# Patient Record
Sex: Male | Born: 1965
Health system: Southern US, Community
[De-identification: ages and names within clinical notes are randomized; demographics above are authoritative.]

## PROBLEM LIST (undated history)

## (undated) DIAGNOSIS — L219 Seborrheic dermatitis, unspecified: Secondary | ICD-10-CM

## (undated) DIAGNOSIS — R2230 Localized swelling, mass and lump, unspecified upper limb: Secondary | ICD-10-CM

## (undated) HISTORY — DX: Localized swelling, mass and lump, unspecified upper limb: R22.30

## (undated) HISTORY — DX: Seborrheic dermatitis, unspecified: L21.9

---

## 2004-05-03 HISTORY — PX: HERNIA REPAIR: SHX51

## 2005-05-03 HISTORY — PX: FOOT SURGERY: SHX648

## 2008-05-03 HISTORY — PX: FRACTURE SURGERY: SHX138

## 2008-09-12 ENCOUNTER — Encounter: Admission: RE | Admit: 2008-09-12 | Discharge: 2008-09-12 | Payer: Self-pay | Admitting: Family Medicine

## 2008-09-18 ENCOUNTER — Ambulatory Visit (HOSPITAL_BASED_OUTPATIENT_CLINIC_OR_DEPARTMENT_OTHER): Admission: RE | Admit: 2008-09-18 | Discharge: 2008-09-18 | Payer: Self-pay | Admitting: Orthopedic Surgery

## 2010-06-06 IMAGING — CR DG FINGER LITTLE 2+V*L*
3 series · 3 of 3 positions shown · non-contrast
Comparison: None

CLINICAL DATA: Sports injury to left fifth finger

LEFT LITTLE FINGER 2+V

[view not recorded (1 of 3)]
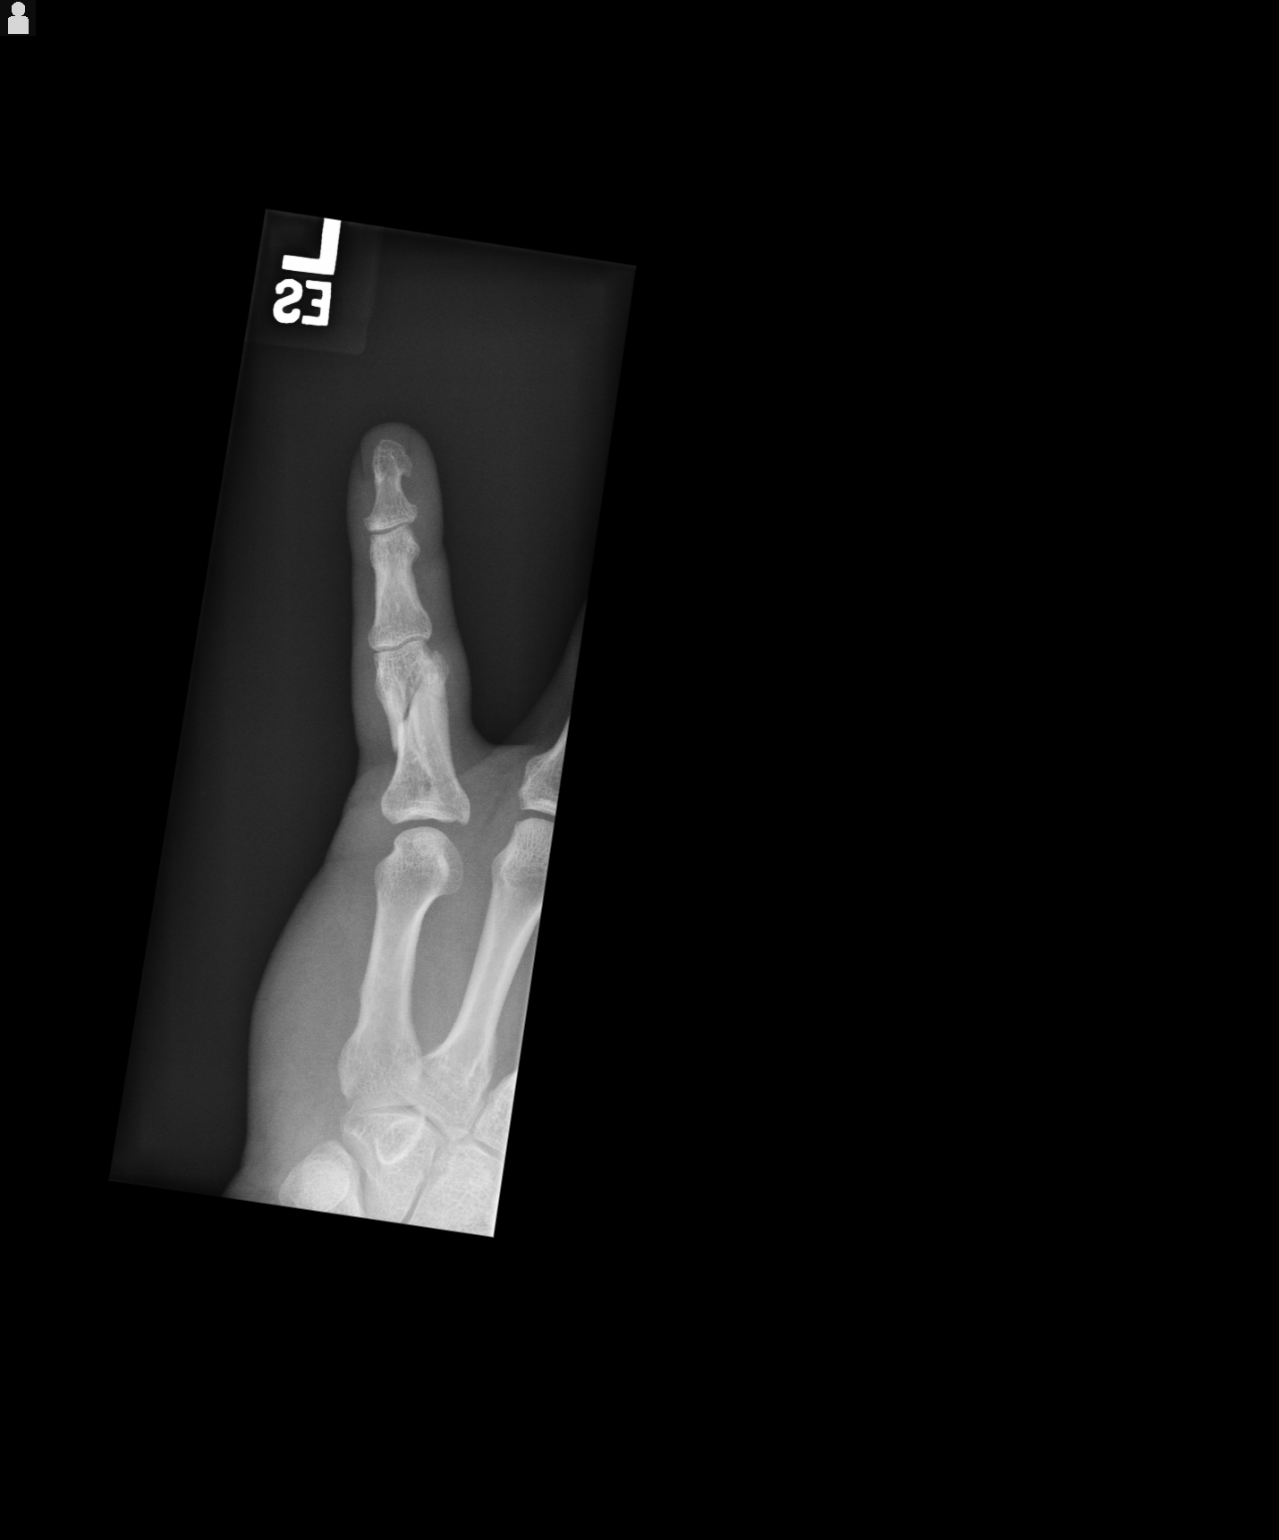

[view not recorded (2 of 3)]
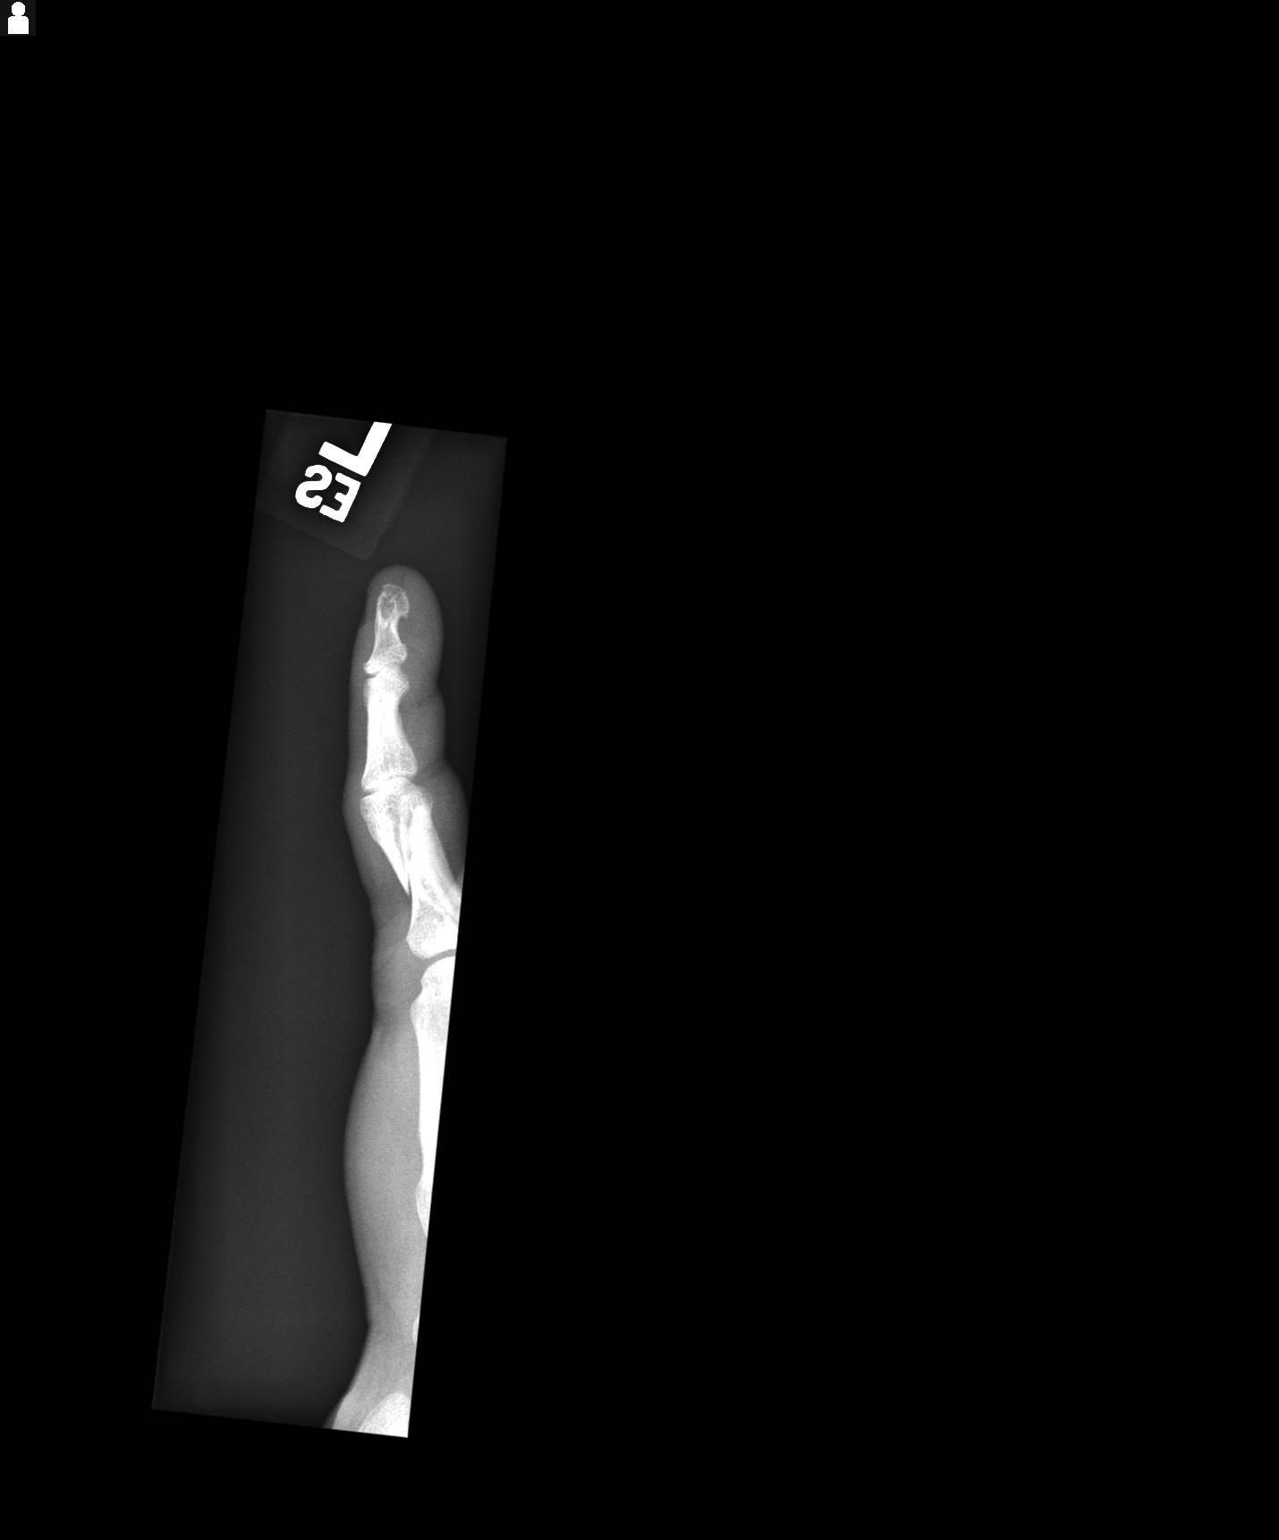

[view not recorded (3 of 3)]
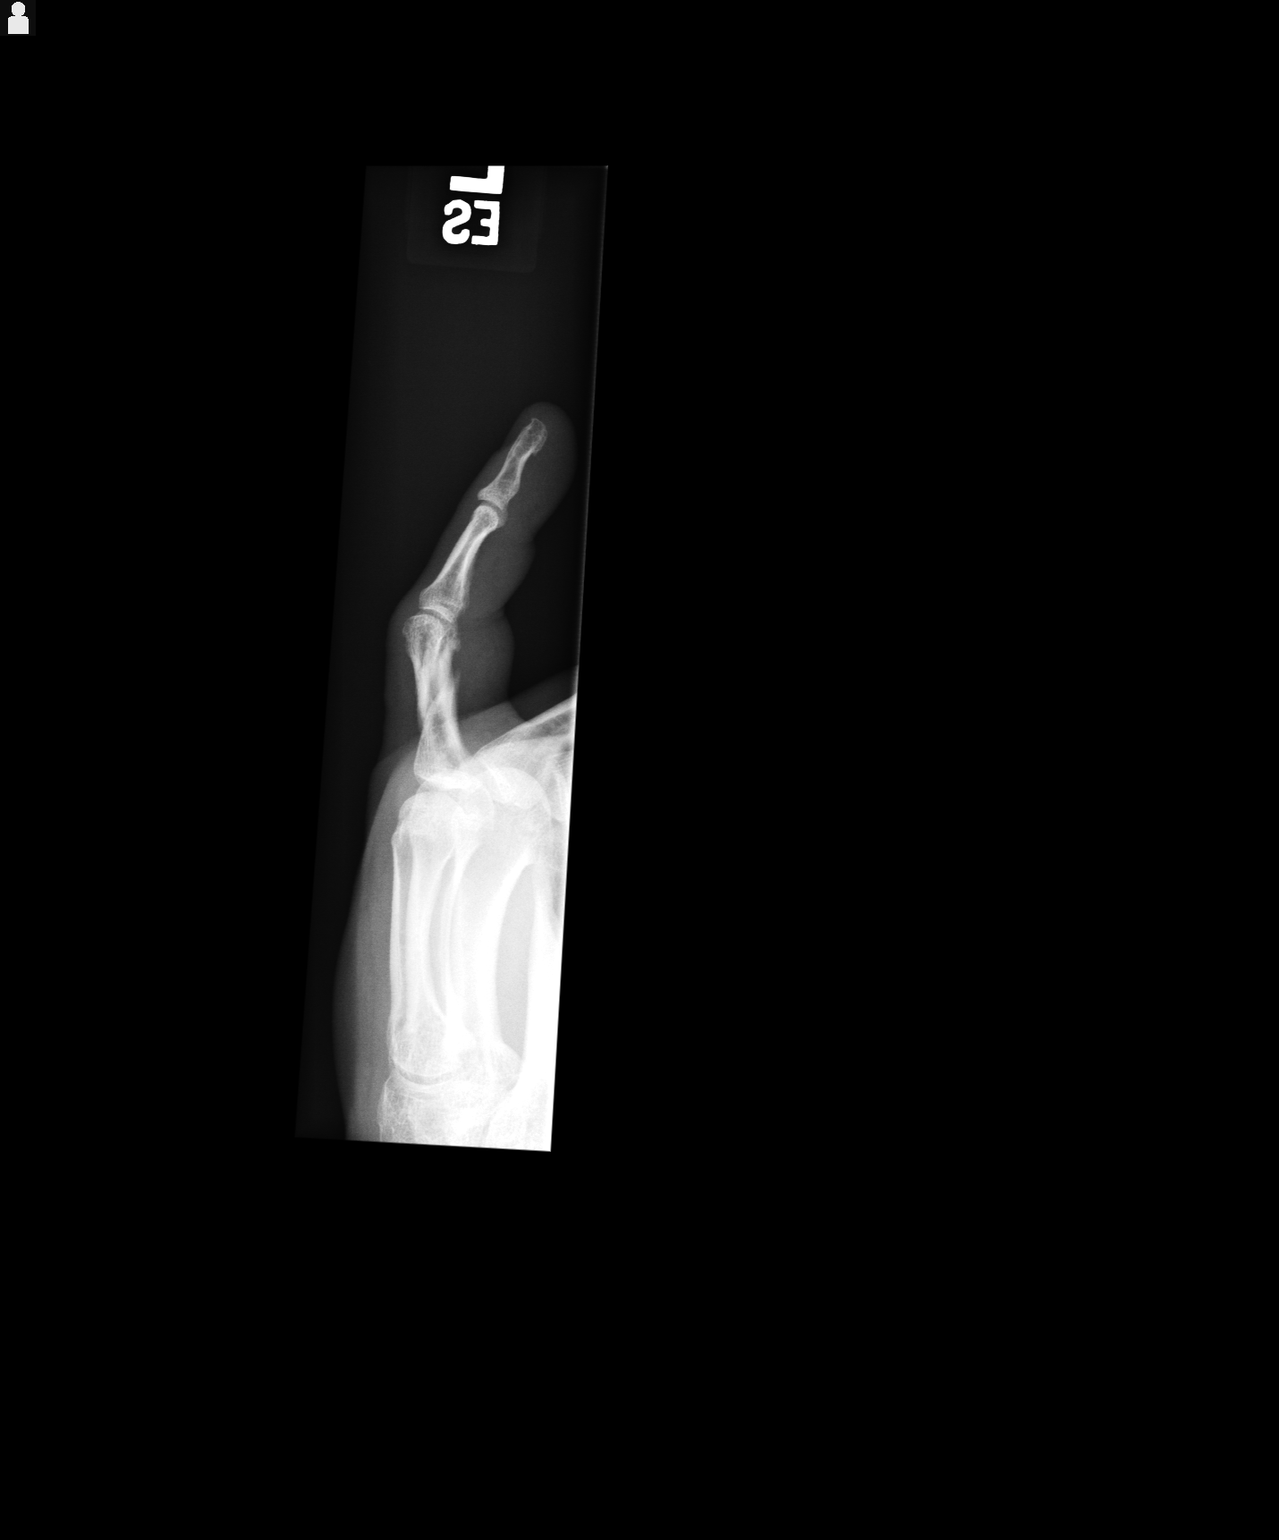

[3 of 3 positions shown; findings below may reference images not displayed]

FINDINGS: Oblique and mildly displaced fracture present through the
mid portion of the proximal phalanx.  This does not appear to
extend to either joint surface.  No other fracture or dislocation
evident.
IMPRESSION: Mildly displaced fracture of the left fifth proximal phalanx.

## 2010-09-15 NOTE — Op Note (Signed)
NAME:  Johnathan Vaughan, Johnathan Vaughan           ACCOUNT NO.:  [id number]   MEDICAL RECORD NO.:  [id number]          PATIENT TYPE:  AMB   LOCATION:  DSC                          FACILITY:  [facility]   PHYSICIAN:  Cindee Salt, M.D.       DATE OF BIRTH:  01/02/1966   DATE OF PROCEDURE:  09/18/2008  DATE OF DISCHARGE:                               OPERATIVE REPORT   PREOPERATIVE DIAGNOSIS:  Comminuted fracture proximal phalanx left  little finger, intra-articular metacarpophalangeal joint.   POSTOPERATIVE DIAGNOSIS:  Comminuted fracture proximal phalanx left  little finger, intra-articular metacarpophalangeal joint.   OPERATION:  Open reduction and internal fixation proximal phalanx  fracture, left little finger.   SURGEON:  Cindee Salt, MD   ASSISTANT:  Carolyne Fiscal, RN   ANESTHESIA:  General.   DATE OF OPERATION:  Sep 18, 2008   ANESTHESIOLOGIST:  Bedelia Person, MD   HISTORY:  The patient is a 45 year old male who suffered an injury to  his left little finger playing basketball.  CT scan reveals that it does  enter into the metacarpophalangeal joint, it is comminuted with 3 large  pieces.  He is aware of risks and complications including infection,  recurrence of injury to arteries, nerves, tendons, incomplete relief of  symptoms, dystrophy, breakdown of fixation, nonunion.  He elected to  undergo open reduction and internal fixation.  In the preoperative area,  the patient is seen.  The extremity marked by both the patient and  surgeon.  Antibiotic given.  Questions again encouraged and answered.  The finger marked   PROCEDURE:  The patient was brought to the operating room where a  general anesthetic was carried out without difficulty under the  direction of Dr. Gypsy Balsam.  He was prepped using ChloraPrep, supine  position, left arm free, 3-minute dry time was allowed, the time-out  taken confirming the patient, procedure, the patient was then draped.  The limb exsanguinated with an Esmarch bandage,  tourniquet placed high  and the arm was inflated to 250 mmHg.  A curvilinear incision was made  over the proximal phalanx just distal to the PIP joint proximal to the  MP joint, carried down through subcutaneous tissue.  Bleeders were  electrocauterized with bipolar.  Neurovascular structures identified and  protected.  The extensor tendon was split longitudinally.  The  periosteum cut on the radial aspect.  The periosteum was then elevated.  The fracture fragments were identified, these were found to be markedly  displaced.  These were opened.  A curette and periosteal elevator, Freer  elevator was then used to remove granulation tissue.  The fracture  fragments were then aligned proximally reconstituting the articular  surface and pinned with 2A K-wires.  The distal fragments were then  similarly reduced, debrided, and pinned with 2A K-wires.  X-rays  confirmed positioning in AP lateral direction.  The finger placed  through full flexion/extension, no angulatory or rotatory deformity was  noted.  The screws were then placed, these were 1.3 mm screws.  Each was  drilled with 1.0-mm drill bit.  The proximal cortex drilled with a 1.3-  mm drill.  These  were countersunk and measured.  These measured between  10 and 16 mm.  Eight screws were used, pins were removed.  The fracture  was quite stable, flexion/extension allowed no angulatory or rotatory  deformity.  No overlap.  X-rays confirmed, the fracture reduced in both  AP and lateral direction.  Wound was copiously irrigated with saline.  The periosteum was then closed with a running 6-0 chromic suture.  The  extensor tendon was repaired with a running 5-0 Mersilene suture.  The  skin was repaired with interrupted 5-0 Vicryl Rapide sutures.  A sterile  compressive dressing and splint applied.  Deflation of the tourniquet,  all fingers immediately pinked.  He was taken to the recovery room for  observation in satisfactory condition.  He  will be discharged home to  return to the [facility] in 1 week on Percocet.           ______________________________  Cindee Salt, M.D.     GK/MEDQ  D:  09/18/2008  T:  09/19/2008  Job:  [id number]

## 2011-05-04 DIAGNOSIS — R2230 Localized swelling, mass and lump, unspecified upper limb: Secondary | ICD-10-CM

## 2011-05-04 HISTORY — DX: Localized swelling, mass and lump, unspecified upper limb: R22.30

## 2012-04-06 ENCOUNTER — Encounter (INDEPENDENT_AMBULATORY_CARE_PROVIDER_SITE_OTHER): Payer: Self-pay | Admitting: Surgery

## 2012-04-06 ENCOUNTER — Ambulatory Visit (INDEPENDENT_AMBULATORY_CARE_PROVIDER_SITE_OTHER): Payer: 59 | Admitting: Surgery

## 2012-04-06 ENCOUNTER — Other Ambulatory Visit: Payer: Self-pay | Admitting: Family Medicine

## 2012-04-06 ENCOUNTER — Ambulatory Visit
Admission: RE | Admit: 2012-04-06 | Discharge: 2012-04-06 | Disposition: A | Discharge status: Home / Self Care | Payer: 59 | Source: Ambulatory Visit | Attending: Family Medicine | Admitting: Family Medicine

## 2012-04-06 VITALS — BP 136/86 | HR 78 | Temp 97.4°F | Resp 16 | Ht 73.0 in | Wt 216.4 lb

## 2012-04-06 DIAGNOSIS — R222 Localized swelling, mass and lump, trunk: Secondary | ICD-10-CM

## 2012-04-06 DIAGNOSIS — R590 Localized enlarged lymph nodes: Secondary | ICD-10-CM

## 2012-04-06 DIAGNOSIS — R599 Enlarged lymph nodes, unspecified: Secondary | ICD-10-CM

## 2012-04-06 MED ORDER — IOHEXOL 300 MG/ML  SOLN
75.0000 mL | Freq: Once | INTRAMUSCULAR | Status: AC | PRN
  Administered 2012-04-06: 75 mL via INTRAVENOUS

## 2012-04-06 NOTE — Progress Notes (Signed)
[facility]  Johnathan Holzer, MD,  FACS [address]  [address] [place], [place]    [address] Phone:  [phone] FAX:  [phone]   Re:   Johnathan Vaughan DOB:   06/03/1965 MRN:   [id number]  Urgent Office  ASSESSMENT AND PLAN: 1.  Left axillary mass  Probable enlarged lymph node, possible lymphoma.  I discussed incisional/excisional left axillary lymph node biopsy.  Risks include, but are not limited, infection, bleeding, nerve injury, and need for more surgery.  This can be done as an outpatient.  I will try to get on early next week.   HISTORY OF PRESENT ILLNESS: Chief Complaint  Patient presents with  . Mass    Axillary mass   Johnathan Vaughan is a 46 y.o. (DOB: 04/20/1966)  white male who is a patient of Johnathan SCOTT, PA and comes to me today for left axillary enlargement.  Johnathan Vaughan was taking a shower yesterday when he noticed a fullness in his left axilla.  This prompted a call to [facility] who started a work up.  He had a CT scan today and was sent to our urgent office.  He had a head cold during Thanksgiving, but no fever, no change in appetite, no weight loss, no night sweats, and no other symptoms.  This appeared without any prior medical problems.  He has otherwise been in good health.  Johnathan Vaughan has a 6.5 x 3.8 cm left axillary nodal mass on CT scan done 04/06/2012.  Past Medical History  Diagnosis Date  . Axillary mass 2013  . Dermatitis seborrheica    Review of Systems: Skin:  Recent right buttocks infection, treated only with antibiotics.  About 6 weeks ago. GI:  [place] splenic injury in [place] in [place].  Did not require surgery.  No long term sequelae.  Right inguinal hernia repair - 2005 (in [place]) Ortho:  Left hand surgery by Dr. G. Vaughan - 2010.  Left hammer toe surgery - 2005.  Family history:  Father had prostate cancer in his 50's, but died of an MI.  Social History: Wife with him. He works as a transportation  consultant.  He did work for [employer].  PHYSICAL EXAM: BP 136/86  Pulse 78  Temp 97.4 F (36.3 C) (Temporal)  Resp 16  Ht 6' 1" (1.854 m)  Wt 216 lb 6.4 oz (98.158 kg)  BMI 28.55 kg/m2  General: WN wM who is alert and generally healthy appearing.  HEENT: Normal. Pupils equal. Good dentition. Neck: Supple. No mass.  No thyroid mass.    Lymph Nodes:  No supraclavicular or cervical nodes.  High in his left axilla he has a fullness c/w an enlarged lymph node. It is not tender or red.  His right axilla is okay. I feel no groin nodes. Lungs: Clear to auscultation and symmetric breath sounds.  On his chest he has a 2 cm red patch that he has seborrheic dermatitis.  He also has some of this rash on his face that he treats with steroid cream. Heart:  RRR. No murmur or rub. Abdomen: Soft. No mass. No tenderness. No hernia. Normal bowel sounds. Extremities:  Good strength and ROM  in upper and lower extremities. Neurologic:  Grossly intact to motor and sensory function. Psychiatric: Has normal mood and affect. Behavior is normal.   DATA REVIEWED: CT scan on 04/06/2012  Johnathan Heimann, MD, FACS Office:  [phone]  

## 2012-04-07 ENCOUNTER — Encounter (HOSPITAL_COMMUNITY)
Admission: RE | Admit: 2012-04-07 | Discharge: 2012-04-07 | Disposition: A | Discharge status: Home / Self Care | Payer: 59 | Source: Ambulatory Visit | Attending: Surgery | Admitting: Surgery

## 2012-04-07 ENCOUNTER — Other Ambulatory Visit (INDEPENDENT_AMBULATORY_CARE_PROVIDER_SITE_OTHER): Payer: Self-pay | Admitting: Surgery

## 2012-04-07 ENCOUNTER — Encounter (HOSPITAL_COMMUNITY): Payer: Self-pay

## 2012-04-07 ENCOUNTER — Encounter (HOSPITAL_COMMUNITY): Payer: Self-pay | Admitting: Pharmacy Technician

## 2012-04-07 LAB — CBC WITH DIFFERENTIAL/PLATELET
Basophils Relative: 1 % (ref 0–1)
Eosinophils Relative: 2 % (ref 0–5)
Hemoglobin: 15 g/dL (ref 13.0–17.0)
Lymphs Abs: 1.8 10*3/uL (ref 0.7–4.0)
MCH: 31.9 pg (ref 26.0–34.0)
MCV: 90.2 fL (ref 78.0–100.0)
Monocytes Absolute: 0.6 10*3/uL (ref 0.1–1.0)
Monocytes Relative: 9 % (ref 3–12)
RBC: 4.7 MIL/uL (ref 4.22–5.81)
WBC: 6.8 10*3/uL (ref 4.0–10.5)

## 2012-04-07 NOTE — Pre-Procedure Instructions (Addendum)
20 DOW BLAHNIK  04/07/2012   Your procedure is scheduled on:  Tuesday December 10  Report to [facility] at 5:30 AM.  Call this number if you have problems the morning of surgery: [phone]   Remember:   Do not eat or drink:After Midnight.    Take these medicines the morning of surgery with A SIP OF WATER: none   Do not wear jewelry, make-up or nail polish.  Do not wear lotions, powders, or perfumes. You may wear deodorant.  Do not shave 48 hours prior to surgery. Men may shave face and neck.  Do not bring valuables to the hospital.  Contacts, dentures or bridgework may not be worn into surgery.  Leave suitcase in the car. After surgery it may be brought to your room.  For patients admitted to the hospital, checkout time is 11:00 AM the day of discharge.   Patients discharged the day of surgery will not be allowed to drive home.    Special Instructions: Shower using CHG 2 nights before surgery and the night before surgery.  If you shower the day of surgery use CHG.  Use special wash - you have one bottle of CHG for all showers.  You should use approximately 1/3 of the bottle for each shower.   Please read over the following fact sheets that you were given: Pain Booklet, Coughing and Deep Breathing and Surgical Site Infection Prevention

## 2012-04-10 MED ORDER — CEFAZOLIN SODIUM-DEXTROSE 2-3 GM-% IV SOLR
2.0000 g | INTRAVENOUS | Status: AC
  Administered 2012-04-11: 2 g via INTRAVENOUS
  Filled 2012-04-10: qty 50

## 2012-04-11 ENCOUNTER — Encounter (HOSPITAL_COMMUNITY)
Admission: RE | Disposition: A | Discharge status: Home / Self Care | Payer: Self-pay | Source: Ambulatory Visit | Attending: Surgery

## 2012-04-11 ENCOUNTER — Ambulatory Visit (HOSPITAL_COMMUNITY): Payer: 59 | Admitting: Certified Registered"

## 2012-04-11 ENCOUNTER — Ambulatory Visit (HOSPITAL_COMMUNITY)
Admission: RE | Admit: 2012-04-11 | Discharge: 2012-04-11 | Disposition: A | Discharge status: Home / Self Care | Payer: 59 | Source: Ambulatory Visit | Attending: Surgery | Admitting: Surgery

## 2012-04-11 ENCOUNTER — Encounter (HOSPITAL_COMMUNITY): Payer: Self-pay | Admitting: Certified Registered"

## 2012-04-11 DIAGNOSIS — R590 Localized enlarged lymph nodes: Secondary | ICD-10-CM

## 2012-04-11 DIAGNOSIS — I82A19 Acute embolism and thrombosis of unspecified axillary vein: Secondary | ICD-10-CM | POA: Insufficient documentation

## 2012-04-11 HISTORY — PX: HEMATOMA EVACUATION: SHX5118

## 2012-04-11 SURGERY — EVACUATION HEMATOMA
Anesthesia: General | Site: Axilla | Laterality: Left | Wound class: Clean

## 2012-04-11 MED ORDER — FENTANYL CITRATE 0.05 MG/ML IJ SOLN
INTRAMUSCULAR | Status: DC | PRN
  Administered 2012-04-11: 100 ug via INTRAVENOUS
  Administered 2012-04-11: 50 ug via INTRAVENOUS
  Administered 2012-04-11: 100 ug via INTRAVENOUS

## 2012-04-11 MED ORDER — PROMETHAZINE HCL 25 MG/ML IJ SOLN
6.2500 mg | INTRAMUSCULAR | Status: AC | PRN
  Administered 2012-04-11 (×2): 6.25 mg via INTRAVENOUS

## 2012-04-11 MED ORDER — LIDOCAINE HCL (PF) 1 % IJ SOLN
INTRAMUSCULAR | Status: AC
  Filled 2012-04-11: qty 30

## 2012-04-11 MED ORDER — 0.9 % SODIUM CHLORIDE (POUR BTL) OPTIME
TOPICAL | Status: DC | PRN
  Administered 2012-04-11: 1000 mL

## 2012-04-11 MED ORDER — HYDROMORPHONE HCL PF 1 MG/ML IJ SOLN
INTRAMUSCULAR | Status: AC
  Filled 2012-04-11: qty 1

## 2012-04-11 MED ORDER — MIDAZOLAM HCL 2 MG/2ML IJ SOLN: 0.5000 mg | Freq: Once | INTRAMUSCULAR | Status: DC | PRN

## 2012-04-11 MED ORDER — MEPERIDINE HCL 25 MG/ML IJ SOLN: 6.2500 mg | INTRAMUSCULAR | Status: DC | PRN

## 2012-04-11 MED ORDER — ARTIFICIAL TEARS OP OINT
TOPICAL_OINTMENT | OPHTHALMIC | Status: DC | PRN
  Administered 2012-04-11: 1 via OPHTHALMIC

## 2012-04-11 MED ORDER — HYDROMORPHONE HCL PF 1 MG/ML IJ SOLN
0.2500 mg | INTRAMUSCULAR | Status: DC | PRN
  Administered 2012-04-11: 0.5 mg via INTRAVENOUS

## 2012-04-11 MED ORDER — LACTATED RINGERS IV SOLN
INTRAVENOUS | Status: DC | PRN
  Administered 2012-04-11: 07:00:00 via INTRAVENOUS

## 2012-04-11 MED ORDER — OXYCODONE HCL 5 MG/5ML PO SOLN: 5.0000 mg | Freq: Once | ORAL | Status: AC | PRN

## 2012-04-11 MED ORDER — CHLORHEXIDINE GLUCONATE 4 % EX LIQD: 1.0000 "application " | Freq: Once | CUTANEOUS | Status: DC

## 2012-04-11 MED ORDER — PROPOFOL 10 MG/ML IV BOLUS
INTRAVENOUS | Status: DC | PRN
  Administered 2012-04-11: 200 mg via INTRAVENOUS

## 2012-04-11 MED ORDER — ONDANSETRON HCL 4 MG/2ML IJ SOLN
INTRAMUSCULAR | Status: DC | PRN
  Administered 2012-04-11: 4 mg via INTRAVENOUS

## 2012-04-11 MED ORDER — LIDOCAINE HCL (PF) 1 % IJ SOLN
INTRAMUSCULAR | Status: DC | PRN
  Administered 2012-04-11: 10 mL

## 2012-04-11 MED ORDER — LIDOCAINE HCL (CARDIAC) 20 MG/ML IV SOLN
INTRAVENOUS | Status: DC | PRN
  Administered 2012-04-11: 40 mg via INTRAVENOUS

## 2012-04-11 MED ORDER — HYDROCODONE-ACETAMINOPHEN 5-325 MG PO TABS: 1.0000 | ORAL_TABLET | Freq: Four times a day (QID) | ORAL | Status: DC | PRN

## 2012-04-11 MED ORDER — PROMETHAZINE HCL 25 MG/ML IJ SOLN
INTRAMUSCULAR | Status: AC
  Filled 2012-04-11: qty 1

## 2012-04-11 MED ORDER — MIDAZOLAM HCL 5 MG/5ML IJ SOLN
INTRAMUSCULAR | Status: DC | PRN
  Administered 2012-04-11: 2 mg via INTRAVENOUS

## 2012-04-11 MED ORDER — OXYCODONE HCL 5 MG PO TABS
5.0000 mg | ORAL_TABLET | Freq: Once | ORAL | Status: AC | PRN
  Administered 2012-04-11: 5 mg via ORAL

## 2012-04-11 MED ORDER — OXYCODONE HCL 5 MG PO TABS
ORAL_TABLET | ORAL | Status: AC
  Filled 2012-04-11: qty 1

## 2012-04-11 SURGICAL SUPPLY — 42 items
BENZOIN TINCTURE PRP APPL 2/3 (GAUZE/BANDAGES/DRESSINGS) IMPLANT
BLADE SURG 15 STRL LF DISP TIS (BLADE) ×2 IMPLANT
BLADE SURG 15 STRL SS (BLADE) ×1
BLADE SURG ROTATE 9660 (MISCELLANEOUS) ×3 IMPLANT
CANISTER SUCTION 2500CC (MISCELLANEOUS) IMPLANT
CHLORAPREP W/TINT 10.5 ML (MISCELLANEOUS) ×3 IMPLANT
CLOTH BEACON ORANGE TIMEOUT ST (SAFETY) ×3 IMPLANT
CONT SPEC 4OZ CLIKSEAL STRL BL (MISCELLANEOUS) ×3 IMPLANT
COVER SURGICAL LIGHT HANDLE (MISCELLANEOUS) ×3 IMPLANT
DECANTER SPIKE VIAL GLASS SM (MISCELLANEOUS) ×3 IMPLANT
DERMABOND ADVANCED (GAUZE/BANDAGES/DRESSINGS) ×1
DERMABOND ADVANCED .7 DNX12 (GAUZE/BANDAGES/DRESSINGS) ×2 IMPLANT
DRAPE PED LAPAROTOMY (DRAPES) ×3 IMPLANT
DRAPE PROXIMA HALF (DRAPES) ×6 IMPLANT
DRAPE UTILITY 15X26 W/TAPE STR (DRAPE) ×6 IMPLANT
ELECT COATED BLADE 2.86 ST (ELECTRODE) ×3 IMPLANT
ELECT REM PT RETURN 9FT ADLT (ELECTROSURGICAL) ×3
ELECTRODE REM PT RTRN 9FT ADLT (ELECTROSURGICAL) ×2 IMPLANT
GLOVE ECLIPSE 6.5 STRL STRAW (GLOVE) ×3 IMPLANT
GLOVE SURG SIGNA 7.5 PF LTX (GLOVE) ×3 IMPLANT
GLOVE SURG SS PI 7.0 STRL IVOR (GLOVE) ×3 IMPLANT
GOWN STRL NON-REIN LRG LVL3 (GOWN DISPOSABLE) ×6 IMPLANT
GOWN STRL REIN XL XLG (GOWN DISPOSABLE) ×3 IMPLANT
KIT BASIN OR (CUSTOM PROCEDURE TRAY) ×3 IMPLANT
KIT ROOM TURNOVER OR (KITS) ×3 IMPLANT
NEEDLE HYPO 25GX1X1/2 BEV (NEEDLE) ×3 IMPLANT
NS IRRIG 1000ML POUR BTL (IV SOLUTION) ×3 IMPLANT
PACK SURGICAL SETUP 50X90 (CUSTOM PROCEDURE TRAY) ×3 IMPLANT
PAD ARMBOARD 7.5X6 YLW CONV (MISCELLANEOUS) ×3 IMPLANT
PENCIL BUTTON HOLSTER BLD 10FT (ELECTRODE) ×3 IMPLANT
SPONGE GAUZE 4X4 12PLY (GAUZE/BANDAGES/DRESSINGS) IMPLANT
SPONGE LAP 18X18 X RAY DECT (DISPOSABLE) ×3 IMPLANT
STRIP CLOSURE SKIN 1/4X4 (GAUZE/BANDAGES/DRESSINGS) IMPLANT
SUT VIC AB 2-0 SH 18 (SUTURE) ×3 IMPLANT
SUT VIC AB 3-0 SH 18 (SUTURE) ×3 IMPLANT
SUT VIC AB 5-0 PS2 18 (SUTURE) ×3 IMPLANT
SYR BULB 3OZ (MISCELLANEOUS) ×3 IMPLANT
SYR CONTROL 10ML LL (SYRINGE) ×3 IMPLANT
TOWEL OR 17X24 6PK STRL BLUE (TOWEL DISPOSABLE) ×3 IMPLANT
TOWEL OR 17X26 10 PK STRL BLUE (TOWEL DISPOSABLE) ×3 IMPLANT
TUBE CONNECTING 12X1/4 (SUCTIONS) IMPLANT
YANKAUER SUCT BULB TIP NO VENT (SUCTIONS) IMPLANT

## 2012-04-11 NOTE — Anesthesia Postprocedure Evaluation (Signed)
  Anesthesia Post-op Note  Patient: Johnathan Vaughan  Procedure(s) Performed: Procedure(s) (LRB) with comments: EVACUATION HEMATOMA (Left) - with biopsy of hematoma  Patient Location: PACU  Anesthesia Type:General  Level of Consciousness: awake, alert , oriented and patient cooperative  Airway and Oxygen Therapy: Patient Spontanous Breathing  Post-op Pain: mild  Post-op Assessment: Post-op Vital signs reviewed, Patient's Cardiovascular Status Stable, Respiratory Function Stable, Patent Airway and Pain level controlled, nausea improved  Post-op Vital Signs: Reviewed and stable  Complications: No apparent anesthesia complications

## 2012-04-11 NOTE — Transfer of Care (Signed)
Immediate Anesthesia Transfer of Care Note  Patient: Johnathan Vaughan  Procedure(s) Performed: Procedure(s) (LRB) with comments: EVACUATION HEMATOMA (Left) - with biopsy of hematoma  Patient Location: PACU  Anesthesia Type:General  Level of Consciousness: awake, alert  and oriented  Airway & Oxygen Therapy: Patient Spontanous Breathing and Patient connected to nasal cannula oxygen  Post-op Assessment: Report given to PACU RN  Post vital signs: Reviewed and stable  Complications: No apparent anesthesia complications

## 2012-04-11 NOTE — Interval H&P Note (Signed)
History and Physical Interval Note:  04/11/2012 7:13 AM  Johnathan Vaughan  has presented today for surgery, with the diagnosis of left axillary lymphadenopathy.  The various methods of treatment have been discussed with the patient and family. His wife is here with him today.     After consideration of risks, benefits and other options for treatment, the patient has consented to  Procedure(s) (LRB) with comments: AXILLARY LYMPH NODE BIOPSY (Left) - left axillary lymph node biopsy as a surgical intervention .    The patient's history has been reviewed, patient examined, no change in status, stable for surgery.  I have reviewed the patient's chart and labs.  Questions were answered to the patient's satisfaction.     Dyneshia Baccam H

## 2012-04-11 NOTE — Anesthesia Procedure Notes (Signed)
Procedure Name: LMA Insertion Date/Time: 04/11/2012 7:49 AM Performed by: Jefm Miles E Pre-anesthesia Checklist: Patient identified, Timeout performed, Emergency Drugs available, Suction available and Patient being monitored Patient Re-evaluated:Patient Re-evaluated prior to inductionOxygen Delivery Method: Circle system utilized Preoxygenation: Pre-oxygenation with 100% oxygen Intubation Type: IV induction LMA: LMA inserted LMA Size: 4.0 Number of attempts: 1 Placement Confirmation: positive ETCO2 and breath sounds checked- equal and bilateral Tube secured with: Tape Dental Injury: Teeth and Oropharynx as per pre-operative assessment

## 2012-04-11 NOTE — H&P (View-Only) (Signed)
[facility]  Johnathan Vaughan, Johnathan Vaughan,  Johnathan Vaughan [address] [place]  [address] [place], [place]    [address] Phone:  [phone] FAX:  [phone]   Re:   Johnathan Vaughan DOB:   02-Dec-1965 MRN:   [id number]  Urgent Office  ASSESSMENT AND PLAN: 1.  Left axillary mass  Probable enlarged lymph node, possible lymphoma.  I discussed incisional/excisional left axillary lymph node biopsy.  Risks include, but are not limited, infection, bleeding, nerve injury, and need for more surgery.  This can be done as an outpatient.  I will try to get on early next week.   HISTORY OF PRESENT ILLNESS: Chief Complaint  Patient presents with  . Mass    Axillary mass   Johnathan Vaughan is a 46 y.o. (DOB: 08/23/1965)  white male who is a patient of GURLEY,CHRISTIAN SCOTT, PA and comes to me today for left axillary enlargement.  [name] was taking a shower yesterday when he noticed a fullness in his left axilla.  This prompted a call to [facility] who started a work up.  He had a CT scan today and was sent to our urgent office.  He had a head cold during Thanksgiving, but no fever, no change in appetite, no weight loss, no night sweats, and no other symptoms.  This appeared without any prior medical problems.  He has otherwise been in good health.  [name] has a 6.5 x 3.8 cm left axillary nodal mass on CT scan done 04/06/2012.  Past Medical History  Diagnosis Date  . Axillary mass 2013  . Dermatitis seborrheica    Review of Systems: Skin:  Recent right buttocks infection, treated only with antibiotics.  About 6 weeks ago. GI:  Had splenic injury in [place] in [place].  Did not require surgery.  No long term sequelae.  Right inguinal hernia repair - 2005 (in [place]) Ortho:  Left hand surgery by Dr. Daryll Brod - 2010.  Left hammer toe surgery - 2005.  Family history:  Father had prostate cancer in his 40's, but died of an MI.  Social History: Wife with him. 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  He did work for [employer].  PHYSICAL EXAM: BP 136/86  Pulse 78  Temp 97.4 F (36.3 C) (Temporal)  Resp 16  Ht 6\' 1"  (1.854 m)  Wt 216 lb 6.4 oz (98.158 kg)  BMI 28.55 kg/m2  General: WN wM who is alert and generally healthy appearing.  HEENT: Normal. Pupils equal. Good dentition. Neck: Supple. No mass.  No thyroid mass.    Lymph Nodes:  No supraclavicular or cervical nodes.  High in his left axilla he has a fullness c/w an enlarged lymph node. It is not tender or red.  His right axilla is okay. I feel no groin nodes. Lungs: Clear to auscultation and symmetric breath sounds.  On his chest he has a 2 cm red patch that he has seborrheic dermatitis.  He also has some of this rash on his face that he treats with steroid cream. Heart:  RRR. No murmur or rub. Abdomen: Soft. No mass. No tenderness. No hernia. Normal bowel sounds. Extremities:  Good strength and ROM  in upper and lower extremities. Neurologic:  Grossly intact to motor and sensory function. Psychiatric: Has normal mood and affect. Behavior is normal.   DATA REVIEWED: CT scan on 04/06/2012  Johnathan Vaughan, Johnathan Vaughan, Johnathan Vaughan Office:  [phone]

## 2012-04-11 NOTE — Op Note (Signed)
04/11/2012  9:10 AM  PATIENT:  Johnathan Vaughan, 46 y.o., male, MRN: [id number]  PREOP DIAGNOSIS:  Left axillary lymphadenopathy  POSTOP DIAGNOSIS:   Left axillary hemorrhagic mass  PROCEDURE:   Procedure(s):  Biopsy of wall of hemorrhagic mass, and EVACUATION HEMATOMA  SURGEON:   Ovidio Kin, M.D.  ASSISTANT:   None  ANESTHESIA:   general  E. Jairo Ben, MD - Anesthesiologist De Nurse, CRNA - CRNA Shireen Quan, CRNA - CRNA De Nurse, CRNA - CRNA  General  EBL:  75  ml  BLOOD ADMINISTERED: none  DRAINS: none   LOCAL MEDICATIONS USED:   10 cc 1/4% marcaine  SPECIMEN:   Clot and wall of hemorrhagic cavity  COUNTS CORRECT:  YES  INDICATIONS FOR PROCEDURE:  Johnathan Vaughan is a 46 y.o. (DOB: [birth year]) white male whose primary care physician is Bradd Burner, [place] and comes for biopsy of left axillary mass.   The indications and risks of the surgery were explained to the patient.  The risks include, but are not limited to, infection, bleeding, and nerve injury.  PROCEDURE NOTE:  The patient was taken to room #9 at [facility] and underwent general anesthesia.  He was given 2 gms of Ancef at the beginning of the operation.   A time out was held and the surgical checklist run.   His left axilla was shaved and prepped with Chloroprep.  I made a left axillary incision and cut down into the left axillary fat pad.  He had a fair amount of fat in the axilla.   But beneath the pectoralis minor, he had an approximate 4 x 6 cm mass that felt like a soft lymph node, though large.  In trying to mobilize the mass, I broke into the mass and got clot out.  I ended up with about 50 cc of clot and I sent this to pathology.  There was some type of capsule left behind.  Whether this was compressed lymph node, vascular wall, or just reactive tissue was unclear.  I resected about 1/2 the cyst wall.  The cyst went medial and posteriors to the pectoralis minor,  and I thought that there was little benefit in trying to excise the entire cyst wall compared to the risk of an injury to a structure high in the axilla. I over sewed the open cyst wall with 2-0 vicryl.   So I sent the clot and the "capsule" to pathology.  I spoke with Dr. Peter Garter about my findings.   I then irrigated the wound with saline, 300 cc.  I infiltrated 10 cc 1/4 % marcaine in the soft tissues..  I closed the axilla in layers with 2-0 and 3-0 vicryl sutures.   The skin was closed with 5-0 vicryl and painted with Dermabond.  He was transferred to the PACU in good condition.  The plan is to let him go home today.  Ovidio Kin, MD, [facility] Pager: [phone] Office phone:  [phone]

## 2012-04-11 NOTE — Preoperative (Signed)
Beta Blockers   Reason not to administer Beta Blockers:Not Applicable 

## 2012-04-11 NOTE — Anesthesia Preprocedure Evaluation (Addendum)
Anesthesia Evaluation  Patient identified by MRN, date of birth, ID band Patient awake    Reviewed: Allergy & Precautions, H&P , NPO status , Patient's Chart, lab work & pertinent test results  History of Anesthesia Complications Negative for: history of anesthetic complications  Airway Mallampati: I TM Distance: >3 FB Neck ROM: Full    Dental  (+) Teeth Intact and Dental Advisory Given   Pulmonary neg pulmonary ROS,  breath sounds clear to auscultation  Pulmonary exam normal       Cardiovascular negative cardio ROS  Rhythm:Regular Rate:Normal     Neuro/Psych negative neurological ROS     GI/Hepatic negative GI ROS, Neg liver ROS,   Endo/Other  negative endocrine ROS  Renal/GU negative Renal ROS     Musculoskeletal   Abdominal   Peds  Hematology negative hematology ROS (+)   Anesthesia Other Findings   Reproductive/Obstetrics                          Anesthesia Physical Anesthesia Plan  ASA: I  Anesthesia Plan: General   Post-op Pain Management:    Induction: Intravenous  Airway Management Planned: LMA  Additional Equipment:   Intra-op Plan:   Post-operative Plan:   Informed Consent: I have reviewed the patients History and Physical, chart, labs and discussed the procedure including the risks, benefits and alternatives for the proposed anesthesia with the patient or authorized representative who has indicated his/her understanding and acceptance.   Dental advisory given  Plan Discussed with: CRNA and Surgeon  Anesthesia Plan Comments: (Plan routine monitors, GA- LMA OK)        Anesthesia Quick Evaluation

## 2012-04-12 ENCOUNTER — Encounter (HOSPITAL_COMMUNITY): Payer: Self-pay | Admitting: Surgery

## 2012-04-12 ENCOUNTER — Telehealth (INDEPENDENT_AMBULATORY_CARE_PROVIDER_SITE_OTHER): Payer: Self-pay

## 2012-04-12 NOTE — Telephone Encounter (Signed)
The pt called in and complains that his axilla is just as swollen as was preop.  I spoke to Dr Ezzard Standing and he said this is expected and will take 6- 8 weeks to go away.  The pt is aware.  He also needs a post op appointment for next week.  I will have Rivka Barbara make that for him and call him back.

## 2012-04-12 NOTE — Telephone Encounter (Signed)
Pt aware of P/O Appt 04/19/12 @ 1245p

## 2012-04-12 NOTE — Telephone Encounter (Signed)
Informed Patient swelling is a normal process and it may take 6-8 weeks for the swelling to go away. Advised him to apply ice pk to the area for 15 minutes Q 4-6hrs Prn. ; he has had a fever of 99.2 advised him to take Tylenol 500mg  q 4-6 hrs    and any fever over 101 to call for OV.Marland Kitchen Patient verbalized understanding .

## 2012-04-13 ENCOUNTER — Telehealth (INDEPENDENT_AMBULATORY_CARE_PROVIDER_SITE_OTHER): Payer: Self-pay | Admitting: General Surgery

## 2012-04-13 NOTE — Telephone Encounter (Signed)
Pt called stating he had developed swelling in axilla under incision. Some swelling above clavicle. Had hematoma evacuated by newman other day. No other symptoms. AF. Incisional bruising. No CP, sob, lightheadedness, arm pain, etc. Advised to keep eye on area. If gets larger call back. call office in AM and let us know how doing

## 2012-04-18 ENCOUNTER — Telehealth (INDEPENDENT_AMBULATORY_CARE_PROVIDER_SITE_OTHER): Payer: Self-pay

## 2012-04-18 NOTE — Telephone Encounter (Signed)
Called patient to see if patient is feeling better. Left message to call if he is still having problems. Patient has appt for 04/19/12

## 2012-04-19 ENCOUNTER — Ambulatory Visit (INDEPENDENT_AMBULATORY_CARE_PROVIDER_SITE_OTHER): Payer: 59 | Admitting: Surgery

## 2012-04-19 ENCOUNTER — Encounter (INDEPENDENT_AMBULATORY_CARE_PROVIDER_SITE_OTHER): Payer: Self-pay | Admitting: Surgery

## 2012-04-19 VITALS — BP 128/82 | HR 76 | Temp 97.1°F | Resp 16 | Ht 73.0 in | Wt 219.8 lb

## 2012-04-19 DIAGNOSIS — R599 Enlarged lymph nodes, unspecified: Secondary | ICD-10-CM

## 2012-04-19 DIAGNOSIS — R590 Localized enlarged lymph nodes: Secondary | ICD-10-CM
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
## 2012-06-17 ENCOUNTER — Other Ambulatory Visit: Payer: Self-pay

## 2012-07-24 ENCOUNTER — Encounter (INDEPENDENT_AMBULATORY_CARE_PROVIDER_SITE_OTHER): Payer: Self-pay

## 2013-02-21 ENCOUNTER — Ambulatory Visit (INDEPENDENT_AMBULATORY_CARE_PROVIDER_SITE_OTHER): Payer: 59 | Admitting: Surgery

## 2013-02-21 ENCOUNTER — Encounter (INDEPENDENT_AMBULATORY_CARE_PROVIDER_SITE_OTHER): Payer: Self-pay | Admitting: Surgery

## 2013-02-21 ENCOUNTER — Other Ambulatory Visit (INDEPENDENT_AMBULATORY_CARE_PROVIDER_SITE_OTHER): Payer: Self-pay

## 2013-02-21 VITALS — BP 120/80 | HR 72 | Temp 98.2°F | Resp 14 | Ht 73.0 in | Wt 222.6 lb

## 2013-02-21 DIAGNOSIS — R079 Chest pain, unspecified: Secondary | ICD-10-CM

## 2013-02-21 NOTE — Progress Notes (Addendum)
[facility]  Ovidio Kin, MD,  FACS [address] [place]  [address] [place], [place]    [address] Phone:  [phone] FAX:  [phone]   Re:   Johnathan Vaughan DOB:   [birth year] MRN:   [id number]  ASSESSMENT AND PLAN: 1.  Left axillary mass.  This proved to be a clot in an aneurysm (venous?).  Increasing mass behind the left pectoralis.  Discussed briefly by phone with Dr. Durwin Nora - will plan CT scan with venous phase and referral to vascular surgery.  I have left his appointment PRN with me.  [Prior authorization:  [id number], Case #: [id number].    I talked to Dr. Domingo Cocking at [facility] - he gave me a authorization# [id number] - [id number], Expires 04/09/2013.  DN 02/23/2013] [CT scan on 02/27/13 - no mass effect, but narrowed left axillary and subclavian vein.  I discussed findings with patient. DN 02/28/2103]  HISTORY OF PRESENT ILLNESS: Chief Complaint  Patient presents with  . Routine Post Op    remaining hematoma from sx   Johnathan Vaughan is a 47 y.o. (DOB: 1965-05-26)  white male who is a patient of GURLEY,Scott, PA-C and comes to me today for follow up of a  left axillary/retropectoral mass.   I excised a vascular abnormality 04/11/2012.  He did well post op and has no functional problems with his left arm.  The area/mass effect under his left axilla/pectoralis muscle never went totally away, but over the last 4+ weeks, he's noticed some "swelling" or increased fullness under his left pectoralis muscle.  He's having no real pain.  The function of this left arm is normal.  History of the left axillary mass: Johnathan Vaughan was taking a shower yesterday when he noticed a fullness in his left axilla.  This prompted a call to [facility] who started a work up.  He had a CT scan today (04/06/2012) and was sent to our urgent office. He had a head cold during Thanksgiving, but no fever, no change in appetite, no weight loss, no night sweats, and no other symptoms.  This  appeared without any prior medical problems.  He has otherwise been in good health. Johnathan Vaughan has a 6.5 x 3.8 cm left axillary nodal mass on CT scan done 04/06/2012. I excised the mass on 04/11/2012.  Past Medical History  Diagnosis Date  . Axillary mass 2013  . Dermatitis seborrheica    Review of Systems: Skin:  Right buttocks infection, treated only with antibiotics - October 2013 GI:  Had splenic injury in [place] in 2008.  Did not require surgery.  No long term sequelae.  Right inguinal hernia repair - 2005 (in [place]) Ortho:  Left hand surgery by Dr. Daryll Brod - 2010.  Left hammer toe surgery - 2005.  Family history:  Father had prostate cancer in his 2's, but died of an MI.  Social History: Married. He works as a Nurse, children's.  He did work for [employer].  PHYSICAL EXAM: BP 120/80  Pulse 72  Temp(Src) 98.2 F (36.8 C) (Temporal)  Resp 14  Ht 6\' 1"  (1.854 m)  Wt 222 lb 9.6 oz (100.971 kg)  BMI 29.37 kg/m2  General: WN WM who is alert and generally healthy appearing.  HEENT: Normal. Pupils equal.  Neck: Supple. No mass.  No thyroid mass.   Lymph Nodes:  No supraclavicular or cervical nodes.  Lungs:  Clear  Chest:  He has some swelling under his left pectoralis major  muscle.  His incision looks good.  I cannot feel a definite mass.   Upper extremities:  Left arm function is normal.  He has a normal radial pulse and no swelling in his left arm. I tried to [place] the area in the left axilla/left chest, but can find no specific mass.  DATA REVIEWED: None new  Ovidio Kin, MD, FACS Office:  [phone]

## 2013-02-22 ENCOUNTER — Telehealth (INDEPENDENT_AMBULATORY_CARE_PROVIDER_SITE_OTHER): Payer: Self-pay | Admitting: *Deleted

## 2013-02-22 NOTE — Telephone Encounter (Signed)
LMOM to inform pt that his CT scheduled for tomorrow 02/23/13 has been cancelled because we have not yet received prior authorization from his insurance company yet.

## 2013-02-23 ENCOUNTER — Ambulatory Visit (HOSPITAL_COMMUNITY): Payer: 59

## 2013-02-26 ENCOUNTER — Telehealth (INDEPENDENT_AMBULATORY_CARE_PROVIDER_SITE_OTHER): Payer: Self-pay | Admitting: *Deleted

## 2013-02-26 NOTE — Telephone Encounter (Signed)
I spoke with pt to inform him of his appt for his CT at [facility] on 02/27/13 with an arrival time of 7:45am.  Instructed pt to have no solid foods 4 hours prior to scan.  Also, informed him that I spoke with [facility] and they stated they are waiting on the results from the CT to schedule appt. Will call pt when I hear something from them.

## 2013-02-27 ENCOUNTER — Telehealth (INDEPENDENT_AMBULATORY_CARE_PROVIDER_SITE_OTHER): Payer: Self-pay | Admitting: *Deleted

## 2013-02-27 ENCOUNTER — Ambulatory Visit (HOSPITAL_COMMUNITY)
Admission: RE | Admit: 2013-02-27 | Discharge: 2013-02-27 | Disposition: A | Discharge status: Home / Self Care | Payer: 59 | Source: Ambulatory Visit | Attending: Surgery | Admitting: Surgery

## 2013-02-27 DIAGNOSIS — S20219A Contusion of unspecified front wall of thorax, initial encounter: Secondary | ICD-10-CM | POA: Insufficient documentation

## 2013-02-27 DIAGNOSIS — M25519 Pain in unspecified shoulder: Secondary | ICD-10-CM | POA: Insufficient documentation

## 2013-02-27 DIAGNOSIS — X58XXXA Exposure to other specified factors, initial encounter: Secondary | ICD-10-CM | POA: Insufficient documentation

## 2013-02-27 DIAGNOSIS — N281 Cyst of kidney, acquired: Secondary | ICD-10-CM | POA: Insufficient documentation

## 2013-02-27 MED ORDER — IOHEXOL 300 MG/ML  SOLN
80.0000 mL | Freq: Once | INTRAMUSCULAR | Status: AC | PRN
  Administered 2013-02-27: 80 mL via INTRAVENOUS

## 2013-02-27 NOTE — Telephone Encounter (Signed)
Pt called and asked if we could refer him to someone other than [facility] so that he could be seen sooner because he is very uncomfortable.  I called [facility] and Dr. Meredeth Ide will be seeing the pt on 10/30 at 10:30am.  Gave pt their address and faxed over notes to their office.  [facility] [address]. [place] [phone]

## 2013-03-08 ENCOUNTER — Other Ambulatory Visit: Payer: Self-pay

## 2013-03-16 ENCOUNTER — Encounter: Payer: 59 | Admitting: Vascular Surgery

## 2014-02-08 ENCOUNTER — Other Ambulatory Visit: Payer: Self-pay | Admitting: Family Medicine

## 2014-02-08 DIAGNOSIS — D179 Benign lipomatous neoplasm, unspecified: Secondary | ICD-10-CM

## 2014-02-12 ENCOUNTER — Ambulatory Visit
Admission: RE | Admit: 2014-02-12 | Discharge: 2014-02-12 | Disposition: A | Discharge status: Home / Self Care | Payer: 59 | Source: Ambulatory Visit | Attending: Family Medicine | Admitting: Family Medicine

## 2014-02-12 DIAGNOSIS — D179 Benign lipomatous neoplasm, unspecified: Secondary | ICD-10-CM

## 2014-04-16 ENCOUNTER — Ambulatory Visit
Admission: RE | Admit: 2014-04-16 | Discharge: 2014-04-16 | Disposition: A | Discharge status: Home / Self Care | Payer: 59 | Source: Ambulatory Visit | Attending: Family Medicine | Admitting: Family Medicine

## 2014-04-16 ENCOUNTER — Other Ambulatory Visit: Payer: Self-pay | Admitting: Family Medicine

## 2014-04-16 DIAGNOSIS — M542 Cervicalgia: Secondary | ICD-10-CM

## 2014-04-16 DIAGNOSIS — M25512 Pain in left shoulder: Secondary | ICD-10-CM

## 2014-04-20 ENCOUNTER — Ambulatory Visit
Admission: RE | Admit: 2014-04-20 | Discharge: 2014-04-20 | Disposition: A | Discharge status: Home / Self Care | Payer: 59 | Source: Ambulatory Visit | Attending: Family Medicine | Admitting: Family Medicine

## 2014-04-20 DIAGNOSIS — M25512 Pain in left shoulder: Secondary | ICD-10-CM

## 2016-01-08 IMAGING — CR DG CERVICAL SPINE 2 OR 3 VIEWS
4 series · 4 of 4 positions shown · non-contrast
Comparison: None.

CLINICAL DATA: Chronic neck pain. Trauma approximately 8 years
prior

EXAM:
CERVICAL SPINE - 2-3 VIEW

[w c-spine lat]
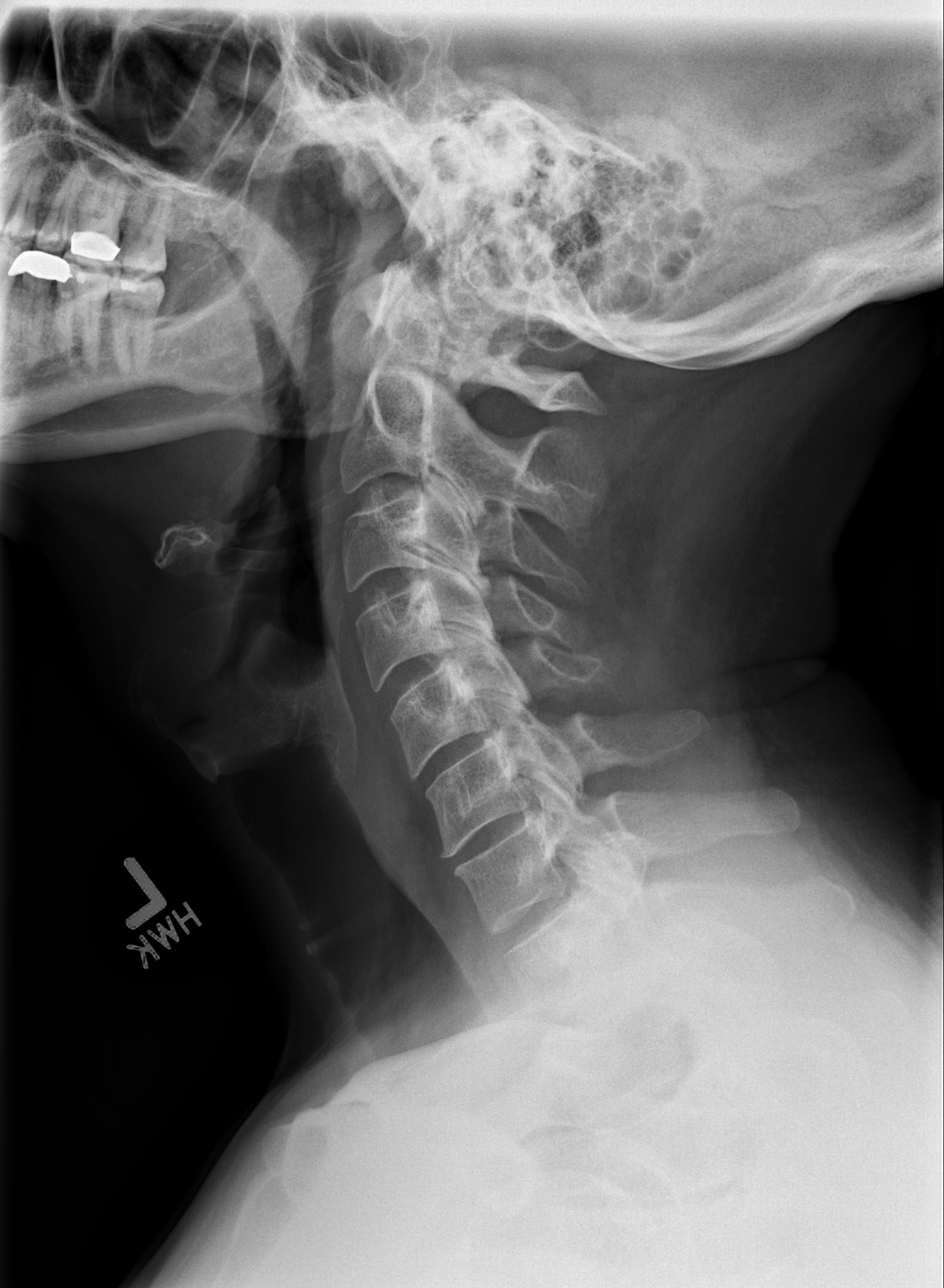

[w c-spine a.p. *]
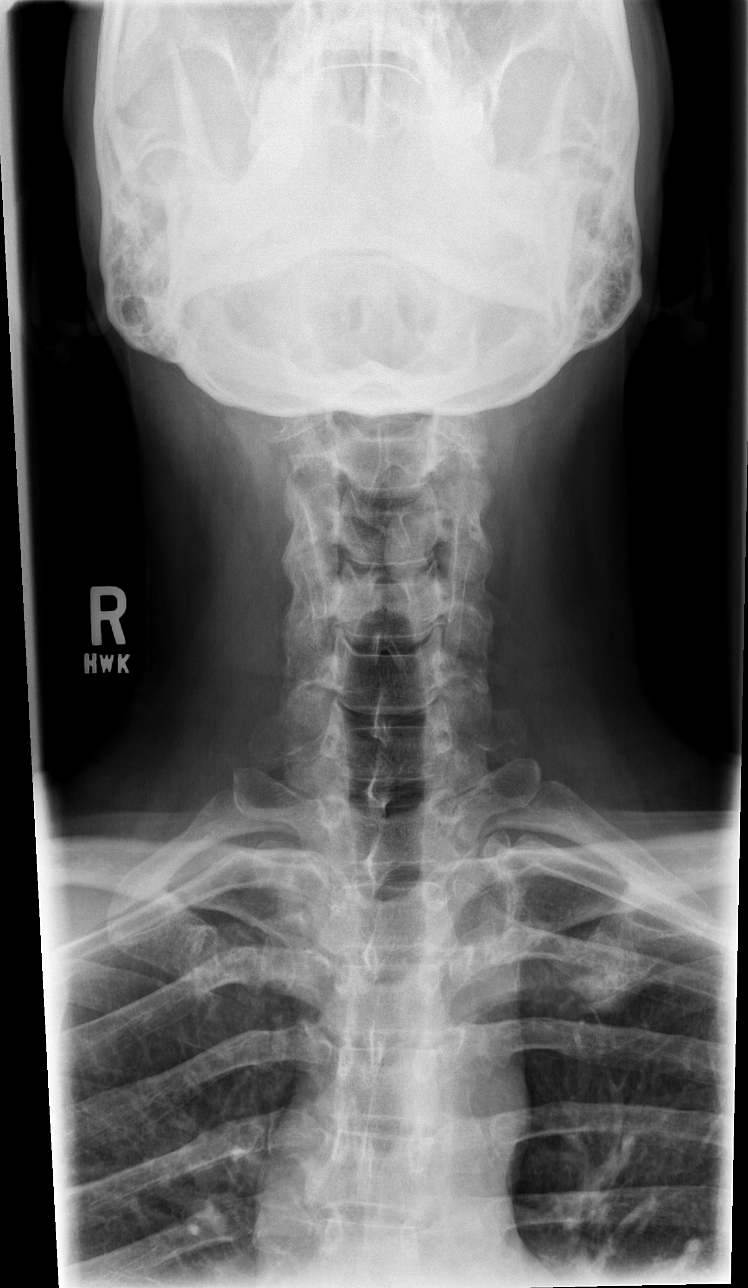

[w c-spine odontoid *]
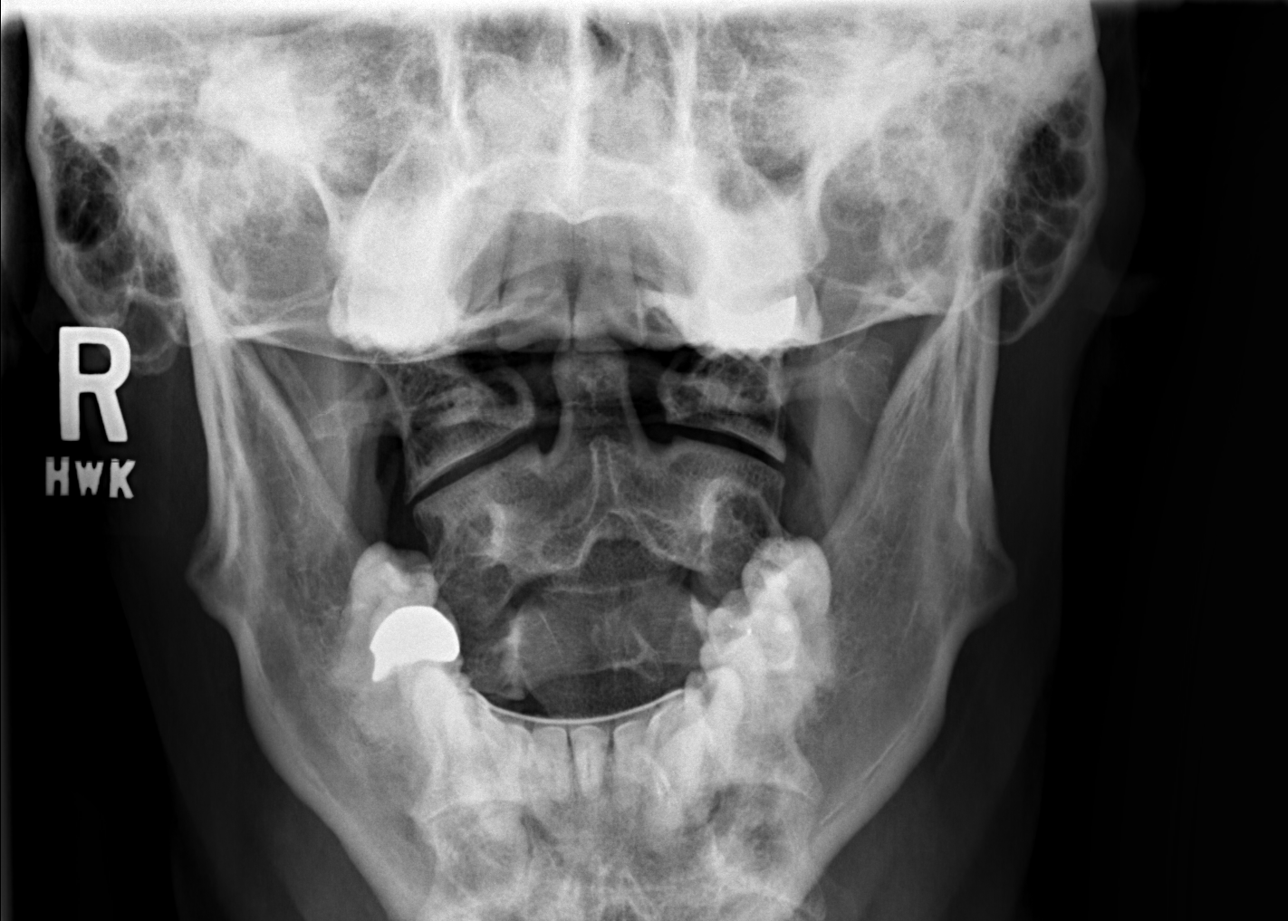

[w swimmers view *]
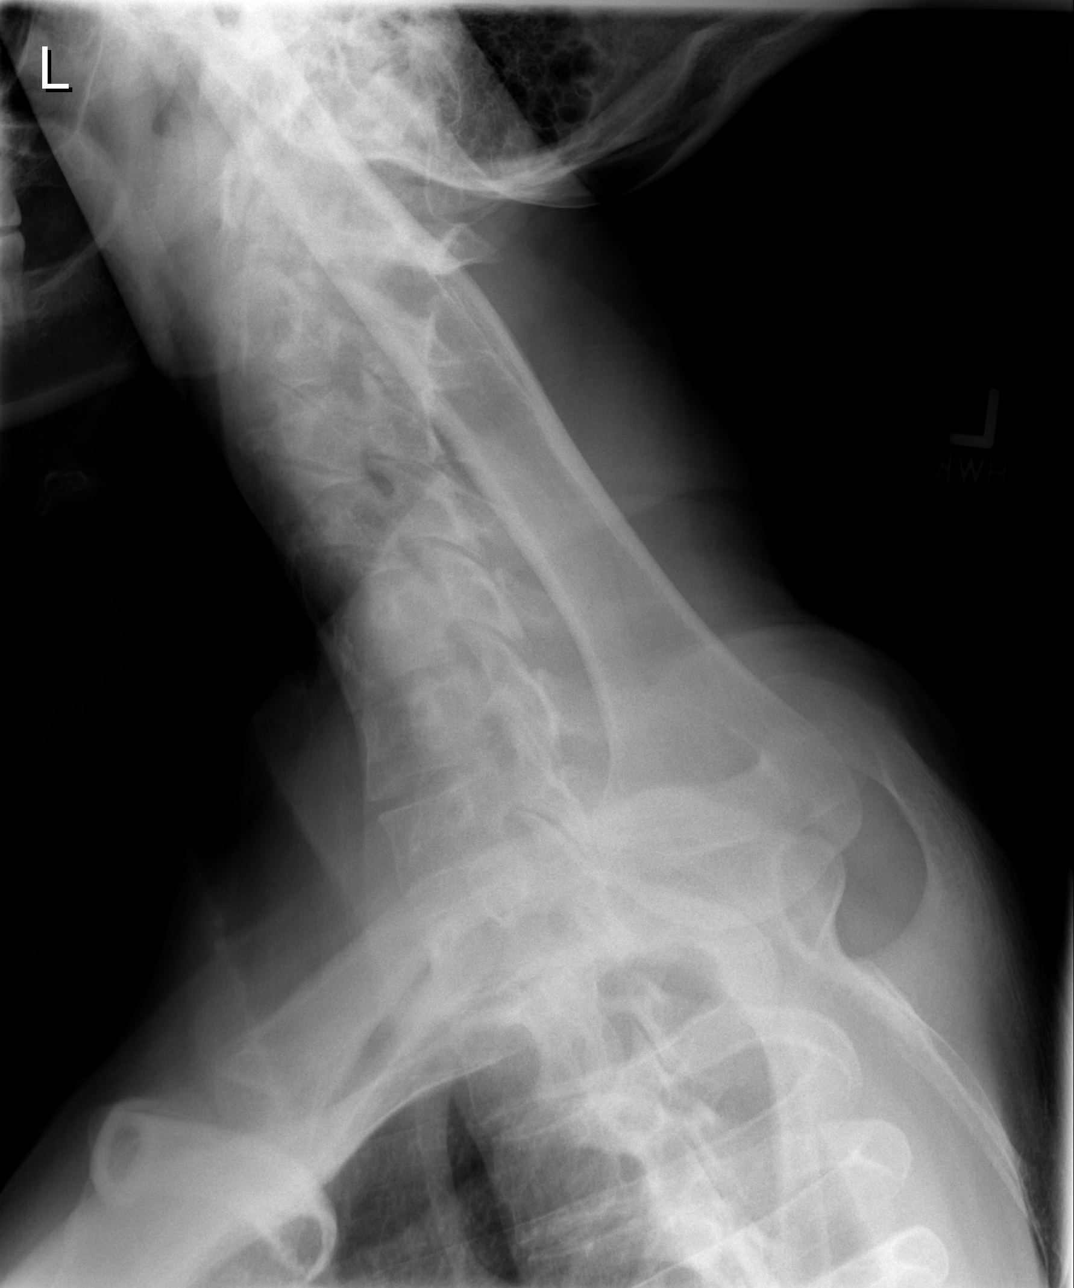

[4 of 4 positions shown; findings below may reference images not displayed]

FINDINGS: Frontal, lateral, and open-mouth odontoid images were obtained.
There is no fracture or spondylolisthesis. Prevertebral soft tissues
and predental space regions are normal. There is slight disc space
narrowing at C6-7. Other disc spaces appear normal. No erosive
change.
IMPRESSION: Mild narrowing C6-7.  No fracture or spondylolisthesis.

## 2016-11-27 DIAGNOSIS — M67911 Unspecified disorder of synovium and tendon, right shoulder: Secondary | ICD-10-CM | POA: Diagnosis not present

## 2016-12-03 DIAGNOSIS — L219 Seborrheic dermatitis, unspecified: Secondary | ICD-10-CM | POA: Diagnosis not present

## 2016-12-03 DIAGNOSIS — J069 Acute upper respiratory infection, unspecified: Secondary | ICD-10-CM | POA: Diagnosis not present

## 2016-12-03 DIAGNOSIS — R030 Elevated blood-pressure reading, without diagnosis of hypertension: Secondary | ICD-10-CM | POA: Diagnosis not present

## 2016-12-11 DIAGNOSIS — J069 Acute upper respiratory infection, unspecified: Secondary | ICD-10-CM | POA: Diagnosis not present

## 2017-08-05 DIAGNOSIS — L219 Seborrheic dermatitis, unspecified: Secondary | ICD-10-CM | POA: Diagnosis not present

## 2017-08-05 DIAGNOSIS — M25532 Pain in left wrist: Secondary | ICD-10-CM | POA: Diagnosis not present

## 2017-08-05 DIAGNOSIS — R030 Elevated blood-pressure reading, without diagnosis of hypertension: Secondary | ICD-10-CM | POA: Diagnosis not present

## 2017-10-27 DIAGNOSIS — R002 Palpitations: Secondary | ICD-10-CM | POA: Diagnosis not present

## 2017-10-31 DIAGNOSIS — R002 Palpitations: Secondary | ICD-10-CM | POA: Diagnosis not present

## 2017-11-01 DIAGNOSIS — R002 Palpitations: Secondary | ICD-10-CM | POA: Diagnosis not present

## 2017-11-01 DIAGNOSIS — R0600 Dyspnea, unspecified: Secondary | ICD-10-CM | POA: Diagnosis not present

## 2017-11-18 DIAGNOSIS — Z0000 Encounter for general adult medical examination without abnormal findings: Secondary | ICD-10-CM | POA: Diagnosis not present

## 2017-11-18 DIAGNOSIS — Z23 Encounter for immunization: Secondary | ICD-10-CM | POA: Diagnosis not present

## 2017-11-18 DIAGNOSIS — E7800 Pure hypercholesterolemia, unspecified: Secondary | ICD-10-CM | POA: Diagnosis not present

## 2017-11-25 DIAGNOSIS — R002 Palpitations: Secondary | ICD-10-CM | POA: Diagnosis not present

## 2017-12-01 DIAGNOSIS — R740 Nonspecific elevation of levels of transaminase and lactic acid dehydrogenase [LDH]: Secondary | ICD-10-CM | POA: Diagnosis not present

## 2017-12-02 DIAGNOSIS — I493 Ventricular premature depolarization: Secondary | ICD-10-CM | POA: Diagnosis not present

## 2017-12-02 DIAGNOSIS — E785 Hyperlipidemia, unspecified: Secondary | ICD-10-CM | POA: Diagnosis not present

## 2017-12-26 DIAGNOSIS — M67911 Unspecified disorder of synovium and tendon, right shoulder: Secondary | ICD-10-CM | POA: Diagnosis not present

## 2017-12-27 DIAGNOSIS — M7541 Impingement syndrome of right shoulder: Secondary | ICD-10-CM | POA: Diagnosis not present

## 2017-12-27 DIAGNOSIS — M7521 Bicipital tendinitis, right shoulder: Secondary | ICD-10-CM | POA: Diagnosis not present

## 2017-12-28 DIAGNOSIS — M7541 Impingement syndrome of right shoulder: Secondary | ICD-10-CM | POA: Diagnosis not present

## 2017-12-28 DIAGNOSIS — M7521 Bicipital tendinitis, right shoulder: Secondary | ICD-10-CM | POA: Diagnosis not present

## 2017-12-30 DIAGNOSIS — R945 Abnormal results of liver function studies: Secondary | ICD-10-CM | POA: Diagnosis not present

## 2018-01-03 DIAGNOSIS — M7521 Bicipital tendinitis, right shoulder: Secondary | ICD-10-CM | POA: Diagnosis not present

## 2018-01-03 DIAGNOSIS — M7541 Impingement syndrome of right shoulder: Secondary | ICD-10-CM | POA: Diagnosis not present

## 2018-01-06 DIAGNOSIS — M7521 Bicipital tendinitis, right shoulder: Secondary | ICD-10-CM | POA: Diagnosis not present

## 2018-01-06 DIAGNOSIS — M7541 Impingement syndrome of right shoulder: Secondary | ICD-10-CM | POA: Diagnosis not present

## 2018-01-10 DIAGNOSIS — M7541 Impingement syndrome of right shoulder: Secondary | ICD-10-CM | POA: Diagnosis not present

## 2018-01-10 DIAGNOSIS — M7521 Bicipital tendinitis, right shoulder: Secondary | ICD-10-CM | POA: Diagnosis not present

## 2018-01-12 DIAGNOSIS — M7541 Impingement syndrome of right shoulder: Secondary | ICD-10-CM | POA: Diagnosis not present

## 2018-01-12 DIAGNOSIS — M7521 Bicipital tendinitis, right shoulder: Secondary | ICD-10-CM | POA: Diagnosis not present

## 2018-01-14 DIAGNOSIS — H109 Unspecified conjunctivitis: Secondary | ICD-10-CM | POA: Diagnosis not present

## 2018-01-17 DIAGNOSIS — M7541 Impingement syndrome of right shoulder: Secondary | ICD-10-CM | POA: Diagnosis not present

## 2018-01-17 DIAGNOSIS — M7521 Bicipital tendinitis, right shoulder: Secondary | ICD-10-CM | POA: Diagnosis not present

## 2018-01-19 DIAGNOSIS — M7541 Impingement syndrome of right shoulder: Secondary | ICD-10-CM | POA: Diagnosis not present

## 2018-01-19 DIAGNOSIS — M7521 Bicipital tendinitis, right shoulder: Secondary | ICD-10-CM | POA: Diagnosis not present

## 2018-01-24 DIAGNOSIS — M7541 Impingement syndrome of right shoulder: Secondary | ICD-10-CM | POA: Diagnosis not present

## 2018-01-24 DIAGNOSIS — M7521 Bicipital tendinitis, right shoulder: Secondary | ICD-10-CM | POA: Diagnosis not present

## 2018-01-26 DIAGNOSIS — M7521 Bicipital tendinitis, right shoulder: Secondary | ICD-10-CM | POA: Diagnosis not present

## 2018-01-26 DIAGNOSIS — M7541 Impingement syndrome of right shoulder: Secondary | ICD-10-CM | POA: Diagnosis not present

## 2018-02-02 DIAGNOSIS — M7541 Impingement syndrome of right shoulder: Secondary | ICD-10-CM | POA: Diagnosis not present

## 2018-02-02 DIAGNOSIS — M7521 Bicipital tendinitis, right shoulder: Secondary | ICD-10-CM | POA: Diagnosis not present

## 2018-02-07 DIAGNOSIS — M7541 Impingement syndrome of right shoulder: Secondary | ICD-10-CM | POA: Diagnosis not present

## 2018-02-07 DIAGNOSIS — M7521 Bicipital tendinitis, right shoulder: Secondary | ICD-10-CM | POA: Diagnosis not present

## 2018-02-09 DIAGNOSIS — M7521 Bicipital tendinitis, right shoulder: Secondary | ICD-10-CM | POA: Diagnosis not present

## 2018-02-09 DIAGNOSIS — M7541 Impingement syndrome of right shoulder: Secondary | ICD-10-CM | POA: Diagnosis not present

## 2018-02-09 DIAGNOSIS — Z23 Encounter for immunization: Secondary | ICD-10-CM | POA: Diagnosis not present

## 2018-02-14 DIAGNOSIS — M7521 Bicipital tendinitis, right shoulder: Secondary | ICD-10-CM | POA: Diagnosis not present

## 2018-02-14 DIAGNOSIS — M7541 Impingement syndrome of right shoulder: Secondary | ICD-10-CM | POA: Diagnosis not present

## 2018-02-16 DIAGNOSIS — M7541 Impingement syndrome of right shoulder: Secondary | ICD-10-CM | POA: Diagnosis not present

## 2018-02-16 DIAGNOSIS — M7521 Bicipital tendinitis, right shoulder: Secondary | ICD-10-CM | POA: Diagnosis not present

## 2018-02-27 DIAGNOSIS — M7541 Impingement syndrome of right shoulder: Secondary | ICD-10-CM | POA: Diagnosis not present

## 2018-02-27 DIAGNOSIS — M7521 Bicipital tendinitis, right shoulder: Secondary | ICD-10-CM | POA: Diagnosis not present

## 2018-03-01 DIAGNOSIS — M7541 Impingement syndrome of right shoulder: Secondary | ICD-10-CM | POA: Diagnosis not present

## 2018-03-01 DIAGNOSIS — M7521 Bicipital tendinitis, right shoulder: Secondary | ICD-10-CM | POA: Diagnosis not present

## 2018-03-03 DIAGNOSIS — M25511 Pain in right shoulder: Secondary | ICD-10-CM | POA: Diagnosis not present

## 2018-03-08 DIAGNOSIS — M7521 Bicipital tendinitis, right shoulder: Secondary | ICD-10-CM | POA: Diagnosis not present

## 2018-03-08 DIAGNOSIS — M7541 Impingement syndrome of right shoulder: Secondary | ICD-10-CM | POA: Diagnosis not present

## 2018-03-23 DIAGNOSIS — H60551 Acute reactive otitis externa, right ear: Secondary | ICD-10-CM | POA: Diagnosis not present

## 2018-04-06 DIAGNOSIS — M25511 Pain in right shoulder: Secondary | ICD-10-CM | POA: Diagnosis not present

## 2018-04-12 DIAGNOSIS — M24811 Other specific joint derangements of right shoulder, not elsewhere classified: Secondary | ICD-10-CM | POA: Diagnosis not present

## 2018-06-21 DIAGNOSIS — H9202 Otalgia, left ear: Secondary | ICD-10-CM | POA: Diagnosis not present

## 2018-07-11 DIAGNOSIS — B349 Viral infection, unspecified: Secondary | ICD-10-CM | POA: Diagnosis not present

## 2018-07-11 DIAGNOSIS — R6889 Other general symptoms and signs: Secondary | ICD-10-CM | POA: Diagnosis not present

## 2018-07-11 DIAGNOSIS — H9209 Otalgia, unspecified ear: Secondary | ICD-10-CM | POA: Diagnosis not present

## 2019-03-26 ENCOUNTER — Other Ambulatory Visit: Payer: Self-pay | Admitting: Otolaryngology

## 2019-03-26 DIAGNOSIS — H9042 Sensorineural hearing loss, unilateral, left ear, with unrestricted hearing on the contralateral side: Secondary | ICD-10-CM

## 2019-03-26 DIAGNOSIS — H833X2 Noise effects on left inner ear: Secondary | ICD-10-CM

## 2019-04-15 ENCOUNTER — Ambulatory Visit
Admission: RE | Admit: 2019-04-15 | Discharge: 2019-04-15 | Disposition: A | Discharge status: Home / Self Care | Payer: Self-pay | Source: Ambulatory Visit | Attending: Otolaryngology | Admitting: Otolaryngology

## 2019-04-15 ENCOUNTER — Other Ambulatory Visit: Payer: Self-pay

## 2019-04-15 DIAGNOSIS — H833X2 Noise effects on left inner ear: Secondary | ICD-10-CM

## 2019-04-15 DIAGNOSIS — H9042 Sensorineural hearing loss, unilateral, left ear, with unrestricted hearing on the contralateral side: Secondary | ICD-10-CM

## 2019-04-15 MED ORDER — GADOBENATE DIMEGLUMINE 529 MG/ML IV SOLN
20.0000 mL | Freq: Once | INTRAVENOUS | Status: AC | PRN
  Administered 2019-04-15: 11:00:00 20 mL via INTRAVENOUS

## 2019-05-01 ENCOUNTER — Ambulatory Visit: Payer: 59 | Attending: Internal Medicine

## 2019-05-01 DIAGNOSIS — U071 COVID-19: Secondary | ICD-10-CM

## 2019-05-02 LAB — NOVEL CORONAVIRUS, NAA: SARS-CoV-2, NAA: DETECTED — AB

## 2020-05-01 ENCOUNTER — Other Ambulatory Visit: Payer: 59

## 2020-05-01 DIAGNOSIS — Z20822 Contact with and (suspected) exposure to covid-19: Secondary | ICD-10-CM

## 2020-05-03 LAB — NOVEL CORONAVIRUS, NAA: SARS-CoV-2, NAA: NOT DETECTED

## 2020-05-03 LAB — SARS-COV-2, NAA 2 DAY TAT

## 2021-07-13 ENCOUNTER — Encounter: Payer: Self-pay | Admitting: Endocrinology

## 2021-07-13 ENCOUNTER — Ambulatory Visit (INDEPENDENT_AMBULATORY_CARE_PROVIDER_SITE_OTHER): Payer: 59 | Admitting: Endocrinology

## 2021-07-13 ENCOUNTER — Other Ambulatory Visit: Payer: Self-pay

## 2021-07-13 LAB — VITAMIN D 25 HYDROXY (VIT D DEFICIENCY, FRACTURES): VITD: 16.19 ng/mL — ABNORMAL LOW (ref 30.00–100.00)

## 2021-07-13 NOTE — Patient Instructions (Signed)
Blood tests are requested for you today.  We'll let you know about the results.   ?If high calcium and low or normal PTH are found, we'll plan to do other blood tests.   ?

## 2021-07-13 NOTE — Progress Notes (Signed)
Subjective:    Patient ID: Johnathan Vaughan, male    DOB: 10-Jun-1965, 56 y.o.   MRN: [id number]  HPI Pt is referred by Dr Azucena Cecil, for hypercalcemia.  Pt was noted to have hypercalcemia in 2022.  He has never had osteoporosis, urolithiasis, thyroid probs, parathyroid probs, sarcoidosis, cancer, PUD, pancreatitis.  He has had these bony fractures: left tibia, left little finger, and left forearm (all traumatic, and many years ago).  He does not take vitamin-D or A supplements. Pt takes Tums, 0-4/day.  No Li++ or HCTZ.  He reports fatigue.   Past Medical History:  Diagnosis Date   Axillary mass 2013   Dermatitis seborrheica     Past Surgical History:  Procedure Laterality Date   FOOT SURGERY  2007   FRACTURE SURGERY  2010   L pinky   HEMATOMA EVACUATION  04/11/2012   Procedure: EVACUATION HEMATOMA;  Surgeon: Kandis Cocking, MD;  Location: [facility] OR;  Service: General;  Laterality: Left;  with biopsy of hematoma   HERNIA REPAIR  2006    Social History   Socioeconomic History   Marital status: Married    Spouse name: Not on file   Number of children: Not on file   Years of education: Not on file   Highest education level: Not on file  Occupational History   Not on file  Tobacco Use   Smoking status: Never   Smokeless tobacco: Not on file  Substance and Sexual Activity   Alcohol use: Yes    Alcohol/week: 5.0 standard drinks    Types: 5 Glasses of wine per week   Drug use: No   Sexual activity: Not on file  Other Topics Concern   Not on file  Social History Narrative   Not on file   Social Determinants of Health   Financial Resource Strain: Not on file  Food Insecurity: Not on file  Transportation Needs: Not on file  Physical Activity: Not on file  Stress: Not on file  Social Connections: Not on file  Intimate Partner Violence: Not on file    Current Outpatient Medications on File Prior to Visit  Medication Sig Dispense Refill   Hydrocortisone Butyrate 0.1 % SOLN  Apply 1 application topically daily as needed. For skin condition.     No current facility-administered medications on file prior to visit.    No Known Allergies  Family History  Problem Relation Age of Onset   Cancer Father        Prostate Cancer   Hypercalcemia Neg Hx     BP (!) 144/88 (BP Location: Left Arm, Patient Position: Sitting, Cuff Size: Normal)   Pulse 92   Ht 6\' 1"  (1.854 m)   Wt 219 lb 12.8 oz (99.7 kg)   SpO2 98%   BMI 29.00 kg/m    Review of Systems Denies weight loss, visual loss, polyuria, numbness, and back pain.      Objective:   Physical Exam VS: see vs page GEN: no distress HEAD: head: no deformity eyes: no periorbital swelling, no proptosis external nose and ears are normal NECK: supple, thyroid is not enlarged CHEST WALL: no deformity.  No kyphosis LUNGS: clear to auscultation CV: reg rate and rhythm, no murmur.  MUSCULOSKELETAL: gait is normal and steady EXTEMITIES: no deformity.  no leg edema NEURO:  readily moves all 4's.  sensation is intact to touch on all 4's SKIN:  Normal texture and temperature.  No rash or suspicious lesion is visible.  NODES:  None palpable at the neck PSYCH: alert, well-oriented.  Does not appear anxious nor depressed.   PTH=15 Ca++=8.9 25-OH Vit-D=26  I have reviewed outside records, and summarized: Pt was noted to have elevated Ca++, and referred here.  He reported sxs c/w hypercalcemia     Assessment & Plan:  Hypercalcemia, new to me, uncertain etiology and prognosis.  Patient Instructions  Blood tests are requested for you today.  We'll let you know about the results.   If high calcium and low or normal PTH are found, we'll plan to do other blood tests.

## 2021-07-14 LAB — PTH, INTACT AND CALCIUM
Calcium: 9.4 mg/dL (ref 8.6–10.3)
PTH: 20 pg/mL (ref 16–77)

## 2022-08-17 ENCOUNTER — Ambulatory Visit: Payer: 59 | Admitting: Family Medicine

## 2022-08-23 ENCOUNTER — Other Ambulatory Visit: Payer: Self-pay

## 2022-08-23 ENCOUNTER — Encounter: Payer: Self-pay | Admitting: Family Medicine

## 2022-08-23 ENCOUNTER — Ambulatory Visit (INDEPENDENT_AMBULATORY_CARE_PROVIDER_SITE_OTHER): Payer: 59 | Admitting: Family Medicine

## 2022-08-23 VITALS — BP 138/86 | Ht 73.0 in | Wt 218.0 lb

## 2022-08-23 DIAGNOSIS — M6289 Other specified disorders of muscle: Secondary | ICD-10-CM | POA: Diagnosis not present

## 2022-08-23 DIAGNOSIS — M67874 Other specified disorders of tendon, left ankle and foot: Secondary | ICD-10-CM | POA: Insufficient documentation

## 2022-08-23 DIAGNOSIS — M7660 Achilles tendinitis, unspecified leg: Secondary | ICD-10-CM

## 2022-08-23 NOTE — Patient Instructions (Signed)
Nice to meet you Please try the heel lifts  Please try the exercises   Please send me a message in MyChart with any questions or updates.  Please see me back to start shockwave therapy .   --Dr. Jordan Likes

## 2022-08-23 NOTE — Assessment & Plan Note (Signed)
Acute on chronic in nature.  Has tried physical therapy with limited success.  If ongoing would consider evaluation lumbar spine -Counseled on home exercise therapy and supportive care. -Could consider further imaging

## 2022-08-23 NOTE — Progress Notes (Signed)
  Johnathan Vaughan - 57 y.o. male MRN [id number]  Date of birth: [birth year]  SUBJECTIVE:  Including CC & ROS.  No chief complaint on file.   Johnathan Vaughan is a 57 y.o. male that is presenting with acute on chronic left left Achilles pain and left hamstring tightness.  The symptoms been ongoing for several months.  He has tried prednisone and physical therapy with limited success.    Review of Systems See HPI   HISTORY: Past Medical, Surgical, Social, and Family History Reviewed & Updated per EMR.   Pertinent Historical Findings include:  Past Medical History:  Diagnosis Date   Axillary mass 2013   Dermatitis seborrheica     Past Surgical History:  Procedure Laterality Date   FOOT SURGERY  2007   FRACTURE SURGERY  2010   L pinky   HEMATOMA EVACUATION  04/11/2012   Procedure: EVACUATION HEMATOMA;  Surgeon: Kandis Cocking, MD;  Location: [facility] OR;  Service: General;  Laterality: Left;  with biopsy of hematoma   HERNIA REPAIR  2006     PHYSICAL EXAM:  VS: BP 138/86 (BP Location: Left Arm, Patient Position: Sitting)   Ht  (1.854 m)   Wt 218 lb (98.9 kg)   BMI 28.76 kg/m  Physical Exam Gen: NAD, alert, cooperative with exam, well-appearing MSK:  Neurovascularly intact    Limited ultrasound: left achilles pain:  No changes at the insertion of the Achilles. Obvious thickening of the midportion of the Achilles. No hyperemia  Summary: Findings consistent with tendinosis  Ultrasound and interpretation by Clare Gandy, MD    ASSESSMENT & PLAN:   Achilles tendinosis of left ankle Acute on chronic in nature.  Significant thickening of the midportion of the Achilles when compared to contralateral side. -Counseled on home exercise therapy and supportive care. -Green sport insoles with heel lift. -Pursue shockwave therapy.   Hamstring tightness of left lower extremity Acute on chronic in nature.  Has tried physical therapy with limited success.   If ongoing would consider evaluation lumbar spine -Counseled on home exercise therapy and supportive care. -Could consider further imaging

## 2022-08-23 NOTE — Assessment & Plan Note (Signed)
Acute on chronic in nature.  Significant thickening of the midportion of the Achilles when compared to contralateral side. -Counseled on home exercise therapy and supportive care. -Green sport insoles with heel lift. -Pursue shockwave therapy.

## 2022-08-26 ENCOUNTER — Ambulatory Visit (INDEPENDENT_AMBULATORY_CARE_PROVIDER_SITE_OTHER): Payer: Self-pay | Admitting: Family Medicine

## 2022-08-26 ENCOUNTER — Encounter: Payer: Self-pay | Admitting: Family Medicine

## 2022-08-26 VITALS — Ht 73.0 in | Wt 218.0 lb

## 2022-08-26 DIAGNOSIS — M67874 Other specified disorders of tendon, left ankle and foot: Secondary | ICD-10-CM

## 2022-08-26 NOTE — Assessment & Plan Note (Signed)
Completed shockwave therapy  

## 2022-08-26 NOTE — Progress Notes (Signed)
  Johnathan Vaughan - 57 y.o. male MRN [id number]  Date of birth: Jan 07, 1966  SUBJECTIVE:  Including CC & ROS.  No chief complaint on file.   Johnathan Vaughan is a 57 y.o. male that is  here for shockwave therapy.    Review of Systems See HPI   HISTORY: Past Medical, Surgical, Social, and Family History Reviewed & Updated per EMR.   Pertinent Historical Findings include:  Past Medical History:  Diagnosis Date   Axillary mass 2013   Dermatitis seborrheica     Past Surgical History:  Procedure Laterality Date   FOOT SURGERY  2007   FRACTURE SURGERY  2010   L pinky   HEMATOMA EVACUATION  04/11/2012   Procedure: EVACUATION HEMATOMA;  Surgeon: Kandis Cocking, MD;  Location: [facility] OR;  Service: General;  Laterality: Left;  with biopsy of hematoma   HERNIA REPAIR  2006     PHYSICAL EXAM:  VS: Ht  (1.854 m)   Wt 218 lb (98.9 kg)   BMI 28.76 kg/m  Physical Exam Gen: NAD, alert, cooperative with exam, well-appearing MSK:  Neurovascularly intact    ECSWT Note Johnathan Vaughan 15-Sep-1965  Procedure: ECSWT Indications: left achilles pain  Procedure Details Consent: Risks of procedure as well as the alternatives and risks of each were explained to the (patient/caregiver).  Consent for procedure obtained. Time Out: Verified patient identification, verified procedure, site/side was marked, verified correct patient position, special equipment/implants available, medications/allergies/relevent history reviewed, required imaging and test results available.  Performed.  The area was cleaned with iodine and alcohol swabs.    The left achilles was targeted for Extracorporeal shockwave therapy.   Preset: achillodynia Power Level: 70 Frequency: 8 Impulse/cycles: 4500 Head size: medium  Session: 1  Patient did tolerate procedure well.    ASSESSMENT & PLAN:   Achilles tendinosis of left ankle Completed shockwave therapy

## 2022-08-31 ENCOUNTER — Encounter: Payer: Self-pay | Admitting: Family Medicine

## 2022-08-31 ENCOUNTER — Ambulatory Visit (INDEPENDENT_AMBULATORY_CARE_PROVIDER_SITE_OTHER): Payer: Self-pay | Admitting: Family Medicine

## 2022-08-31 VITALS — Ht 73.0 in | Wt 218.0 lb

## 2022-08-31 DIAGNOSIS — M67874 Other specified disorders of tendon, left ankle and foot: Secondary | ICD-10-CM

## 2022-08-31 NOTE — Assessment & Plan Note (Signed)
Completed shockwave therapy  

## 2022-08-31 NOTE — Progress Notes (Signed)
  Yusef Lamp - 57 y.o. male MRN [id number]  Date of birth: 05-24-1965  SUBJECTIVE:  Including CC & ROS.  No chief complaint on file.   Erle Guster is a 57 y.o. male that is  here for shockwave therapy.    Review of Systems See HPI   HISTORY: Past Medical, Surgical, Social, and Family History Reviewed & Updated per EMR.   Pertinent Historical Findings include:  Past Medical History:  Diagnosis Date   Axillary mass 2013   Dermatitis seborrheica     Past Surgical History:  Procedure Laterality Date   FOOT SURGERY  2007   FRACTURE SURGERY  2010   L pinky   HEMATOMA EVACUATION  04/11/2012   Procedure: EVACUATION HEMATOMA;  Surgeon: Kandis Cocking, MD;  Location: [facility] OR;  Service: General;  Laterality: Left;  with biopsy of hematoma   HERNIA REPAIR  2006     PHYSICAL EXAM:  VS: Ht 6\' 1"  (1.854 m)   Wt 218 lb (98.9 kg)   BMI 28.76 kg/m  Physical Exam Gen: NAD, alert, cooperative with exam, well-appearing MSK:  Neurovascularly intact    ECSWT Note Akira Adelsberger 1965/06/29  Procedure: ECSWT Indications: left achilles pain  Procedure Details Consent: Risks of procedure as well as the alternatives and risks of each were explained to the (patient/caregiver).  Consent for procedure obtained. Time Out: Verified patient identification, verified procedure, site/side was marked, verified correct patient position, special equipment/implants available, medications/allergies/relevent history reviewed, required imaging and test results available.  Performed.  The area was cleaned with iodine and alcohol swabs.    The left achilles was targeted for Extracorporeal shockwave therapy.   Preset: achillodynia  Power Level: 80 Frequency: 8 Impulse/cycles: 4500 Head size: medium  Session: 2  Patient did tolerate procedure well.    ASSESSMENT & PLAN:   Achilles tendinosis of left ankle Completed shockwave therapy

## 2022-09-02 ENCOUNTER — Ambulatory Visit (INDEPENDENT_AMBULATORY_CARE_PROVIDER_SITE_OTHER): Payer: Self-pay | Admitting: Family Medicine

## 2022-09-02 ENCOUNTER — Encounter: Payer: Self-pay | Admitting: Family Medicine

## 2022-09-02 VITALS — Ht 73.0 in | Wt 218.0 lb

## 2022-09-02 DIAGNOSIS — M67874 Other specified disorders of tendon, left ankle and foot: Secondary | ICD-10-CM

## 2022-09-02 NOTE — Assessment & Plan Note (Signed)
Completed shockwave therapy  

## 2022-09-02 NOTE — Progress Notes (Signed)
  Johnathan Vaughan - 57 y.o. male MRN [id number]  Date of birth: 01-04-1966  SUBJECTIVE:  Including CC & ROS.  No chief complaint on file.   Johnathan Vaughan is a 57 y.o. male that is  here for shockwave therapy.    Review of Systems See HPI   HISTORY: Past Medical, Surgical, Social, and Family History Reviewed & Updated per EMR.   Pertinent Historical Findings include:  Past Medical History:  Diagnosis Date   Axillary mass 2013   Dermatitis seborrheica     Past Surgical History:  Procedure Laterality Date   FOOT SURGERY  2007   FRACTURE SURGERY  2010   L pinky   HEMATOMA EVACUATION  04/11/2012   Procedure: EVACUATION HEMATOMA;  Surgeon: Kandis Cocking, MD;  Location: [facility] OR;  Service: General;  Laterality: Left;  with biopsy of hematoma   HERNIA REPAIR  2006     PHYSICAL EXAM:  VS: Ht 6\' 1"  (1.854 m)   Wt 218 lb (98.9 kg)   BMI 28.76 kg/m  Physical Exam Gen: NAD, alert, cooperative with exam, well-appearing MSK:  Neurovascularly intact    ECSWT Note Johnathan Vaughan 19-Sep-1965  Procedure: ECSWT Indications: left achilles pain  Procedure Details Consent: Risks of procedure as well as the alternatives and risks of each were explained to the (patient/caregiver).  Consent for procedure obtained. Time Out: Verified patient identification, verified procedure, site/side was marked, verified correct patient position, special equipment/implants available, medications/allergies/relevent history reviewed, required imaging and test results available.  Performed.  The area was cleaned with iodine and alcohol swabs.    The left achilles was targeted for Extracorporeal shockwave therapy.   Preset: achillodynia Power Level: 110 Frequency: 8 Impulse/cycles: 5000 Head size: medium  Session: 3  Patient did tolerate procedure well.    ASSESSMENT & PLAN:   Achilles tendinosis of left ankle Completed shockwave therapy

## 2022-09-08 ENCOUNTER — Encounter: Payer: Self-pay | Admitting: Family Medicine

## 2022-09-08 ENCOUNTER — Ambulatory Visit (INDEPENDENT_AMBULATORY_CARE_PROVIDER_SITE_OTHER): Payer: Self-pay | Admitting: Family Medicine

## 2022-09-08 VITALS — Ht 73.0 in | Wt 218.0 lb

## 2022-09-08 DIAGNOSIS — M67874 Other specified disorders of tendon, left ankle and foot: Secondary | ICD-10-CM

## 2022-09-08 NOTE — Assessment & Plan Note (Signed)
Completed shockwave therapy  

## 2022-09-08 NOTE — Progress Notes (Signed)
  Johnathan Vaughan - 57 y.o. male MRN [id number]  Date of birth: Dec 01, 1965  SUBJECTIVE:  Including CC & ROS.  No chief complaint on file.   Johnathan Vaughan is a 57 y.o. male that is  here for shockwave therapy.    Review of Systems See HPI   HISTORY: Past Medical, Surgical, Social, and Family History Reviewed & Updated per EMR.   Pertinent Historical Findings include:  Past Medical History:  Diagnosis Date   Axillary mass 2013   Dermatitis seborrheica     Past Surgical History:  Procedure Laterality Date   FOOT SURGERY  2007   FRACTURE SURGERY  2010   L pinky   HEMATOMA EVACUATION  04/11/2012   Procedure: EVACUATION HEMATOMA;  Surgeon: Kandis Cocking, MD;  Location: [facility] OR;  Service: General;  Laterality: Left;  with biopsy of hematoma   HERNIA REPAIR  2006     PHYSICAL EXAM:  VS: Ht 6\' 1"  (1.854 m)   Wt 218 lb (98.9 kg)   BMI 28.76 kg/m  Physical Exam Gen: NAD, alert, cooperative with exam, well-appearing MSK:  Neurovascularly intact    ECSWT Note Johnathan Vaughan 1966/01/04  Procedure: ECSWT Indications: left achilles pain  Procedure Details Consent: Risks of procedure as well as the alternatives and risks of each were explained to the (patient/caregiver).  Consent for procedure obtained. Time Out: Verified patient identification, verified procedure, site/side was marked, verified correct patient position, special equipment/implants available, medications/allergies/relevent history reviewed, required imaging and test results available.  Performed.  The area was cleaned with iodine and alcohol swabs.    The left achilles was targeted for Extracorporeal shockwave therapy.   Preset: achillodynia Power Level: 90 Frequency: 10 Impulse/cycles: 5000 Head size: medium  Session: 4  Patient did tolerate procedure well.    ASSESSMENT & PLAN:   Achilles tendinosis of left ankle Completed shockwave therapy

## 2024-04-19 ENCOUNTER — Telehealth: Payer: Self-pay | Admitting: Family Medicine

## 2024-04-19 DIAGNOSIS — B9689 Other specified bacterial agents as the cause of diseases classified elsewhere: Secondary | ICD-10-CM

## 2024-04-19 DIAGNOSIS — J019 Acute sinusitis, unspecified: Secondary | ICD-10-CM

## 2024-04-19 MED ORDER — BENZONATATE 100 MG PO CAPS: 100.0000 mg | ORAL_CAPSULE | Freq: Three times a day (TID) | ORAL | 0 refills | Status: AC | PRN

## 2024-04-19 MED ORDER — PROMETHAZINE-DM 6.25-15 MG/5ML PO SYRP: 5.0000 mL | ORAL_SOLUTION | Freq: Four times a day (QID) | ORAL | 0 refills | Status: AC | PRN

## 2024-04-19 MED ORDER — AZITHROMYCIN 250 MG PO TABS: ORAL_TABLET | ORAL | 0 refills | Status: AC

## 2024-04-19 NOTE — Patient Instructions (Addendum)
 Johnathan Vaughan, thank you for joining Johnathan CHRISTELLA Barefoot, NP for today's virtual visit.  While this provider is not your primary care provider (PCP), if your PCP is located in our provider database this encounter information will be shared with them immediately following your visit.   A [facility] MyChart account gives you access to today's visit and all your visits, tests, and labs performed at [facility]  click here if you don't have a [facility] MyChart account or go to [email or web address]  Consent: (Patient) Johnathan Vaughan provided verbal consent for this virtual visit at the beginning of the encounter.  Current Medications:  Current Outpatient Medications:    azithromycin  (ZITHROMAX ) 250 MG tablet, Take 2 tablets on day 1, then 1 tablet daily on days 2 through 5, Disp: 6 tablet, Rfl: 0   benzonatate  (TESSALON ) 100 MG capsule, Take 1-2 capsules (100-200 mg total) by mouth 3 (three) times daily as needed., Disp: 30 capsule, Rfl: 0   promethazine -dextromethorphan (PROMETHAZINE -DM) 6.25-15 MG/5ML syrup, Take 5 mLs by mouth 4 (four) times daily as needed for cough., Disp: 118 mL, Rfl: 0   Hydrocortisone Butyrate 0.1 % SOLN, Apply 1 application topically daily as needed. For skin condition., Disp: , Rfl:    Medications ordered in this encounter:  Meds ordered this encounter  Medications   azithromycin  (ZITHROMAX ) 250 MG tablet    Sig: Take 2 tablets on day 1, then 1 tablet daily on days 2 through 5    Dispense:  6 tablet    Refill:  0    Supervising Provider:   LAMPTEY, PHILIP Vaughan [[id number]]   promethazine -dextromethorphan (PROMETHAZINE -DM) 6.25-15 MG/5ML syrup    Sig: Take 5 mLs by mouth 4 (four) times daily as needed for cough.    Dispense:  118 mL    Refill:  0    Supervising Provider:   LAMPTEY, PHILIP Vaughan [[id number]]   benzonatate  (TESSALON ) 100 MG capsule    Sig: Take 1-2 capsules (100-200 mg total) by mouth 3 (three) times daily as needed.     Dispense:  30 capsule    Refill:  0    Supervising Provider:   BLAISE ALEENE Vaughan [[id number]]     *If you need refills on other medications prior to your next appointment, please contact your pharmacy*  Follow-Up: Call back or seek an in-person evaluation if the symptoms worsen or if the condition fails to improve as anticipated.  [facility] [phone]  Other Instructions   -Delay in Z pack until Monday- if symptoms worsen, or fever spikes with heavy increased in congestion -Use Promethazine  DM alone- not with other OTC cough meds with DM in them - Increased rest - Increasing Fluids - Acetaminophen  / ibuprofen as needed for fever/pain.  - Salt water gargling, chloraseptic spray and throat lozenges -Flonase for sinus and ear pressure  - Saline nasal spray if congestion or if nasal passages feel dry. - Humidifying the air.    If you have been instructed to have an in-person evaluation today at a local Urgent Care facility, please use the link below. It will take you to a list of all of our available [facility], including address, phone number and hours of operation. Please do not delay care.  [facility]  If you or a family member do not have a primary care provider, use the link below to schedule a visit and establish care. When you choose a [facility] primary  care physician or advanced practice provider, you gain a long-term partner in health. Find a Primary Care Provider  Learn more about [facility] in-office and virtual care options: [facility] - Get Care Now

## 2024-04-19 NOTE — Progress Notes (Signed)
 Virtual Visit Consent   Johnathan Vaughan, you are scheduled for a virtual visit with a [facility] provider today. Just as with appointments in the office, your consent must be obtained to participate. Your consent will be active for this visit and any virtual visit you may have with one of our providers in the next 365 days. If you have a MyChart account, a copy of this consent can be sent to you electronically.  As this is a virtual visit, video technology does not allow for your provider to perform a traditional examination. This may limit your provider's ability to fully assess your condition. If your provider identifies any concerns that need to be evaluated in person or the need to arrange testing (such as labs, EKG, etc.), we will make arrangements to do so. Although advances in technology are sophisticated, we cannot ensure that it will always work on either your end or our end. If the connection with a video visit is poor, the visit may have to be switched to a telephone visit. With either a video or telephone visit, we are not always able to ensure that we have a secure connection.  By engaging in this virtual visit, you consent to the provision of healthcare and authorize for your insurance to be billed (if applicable) for the services provided during this visit. Depending on your insurance coverage, you may receive a charge related to this service.  I need to obtain your verbal consent now. Are you willing to proceed with your visit today? Clarence Cogswell has provided verbal consent on 04/19/2024 for a virtual visit (video or telephone). Chiquita CHRISTELLA Barefoot, NP  Date: 04/19/2024 9:31 AM   Virtual Visit via Video Note   I, Chiquita CHRISTELLA Barefoot, connected with  Jaysen Wey  ([id number], 1965/07/31) on 04/19/2024 at  9:30 AM EST by a video-enabled telemedicine application and verified that I am speaking with the correct person using two  identifiers.  Location: Patient: Virtual Visit Location Patient: Home Provider: Virtual Visit Location Provider: Home Office   I discussed the limitations of evaluation and management by telemedicine and the availability of in person appointments. The patient expressed understanding and agreed to proceed.    History of Present Illness: Johnathan Vaughan is a 58 y.o. who identifies as a male who was assigned male at birth, and is being seen today for cough and congestion  Onset was Monday noticed started feeling poorly, by Tuesday cough and congestion, head congestion, headache Will be flying tomorrow evening  Associated symptoms are ear feels plugged on the left side, non productive cough, some chest tightness  Modifying factors are ny quil and day quil  Denies chest pain, shortness of breath, fevers, chills  Exposure to sick contacts- unknown- but traveled over the weekend prior  COVID test: neg Vaccines:  covid and flu this year   Problems:  Patient Active Problem List   Diagnosis Date Noted   Achilles tendinosis of left ankle 08/23/2022   Hamstring tightness of left lower extremity 08/23/2022   Hypercalcemia 07/13/2021   Axillary lymphadenopathy, left. 04/06/2012    Allergies: Allergies[1] Medications: Current Medications[2]  Observations/Objective: Patient is well-developed, well-nourished in no acute distress.  Resting comfortably  at home.  Head is normocephalic, atraumatic.  No labored breathing.  Speech is clear and coherent with logical content.  Patient is alert and oriented at baseline.    Assessment and Plan:   1. Acute bacterial sinusitis (Primary) - azithromycin  (ZITHROMAX ) 250 MG tablet;  Take 2 tablets on day 1, then 1 tablet daily on days 2 through 5  Dispense: 6 tablet; Refill: 0 - promethazine -dextromethorphan (PROMETHAZINE -DM) 6.25-15 MG/5ML syrup; Take 5 mLs by mouth 4 (four) times daily as needed for cough.  Dispense: 118 mL; Refill: 0 -  benzonatate  (TESSALON ) 100 MG capsule; Take 1-2 capsules (100-200 mg total) by mouth 3 (three) times daily as needed.  Dispense: 30 capsule; Refill: 0   -Delay in Z pack until Monday- if symptoms worsen, or fever spikes with heavy increased in congestion -Use Promethazine  DM alone- not with other OTC cough meds with DM in them - Increased rest - Increasing Fluids - Acetaminophen  / ibuprofen as needed for fever/pain.  - Salt water gargling, chloraseptic spray and throat lozenges -Flonase for sinus and ear pressure  - Saline nasal spray if congestion or if nasal passages feel dry. - Humidifying the air.     Reviewed side effects, risks and benefits of medication.    Patient acknowledged agreement and understanding of the plan.   Past Medical, Surgical, Social History, Allergies, and Medications have been Reviewed.    Follow Up Instructions: I discussed the assessment and treatment plan with the patient. The patient was provided an opportunity to ask questions and all were answered. The patient agreed with the plan and demonstrated an understanding of the instructions.  A copy of instructions were sent to the patient via MyChart unless otherwise noted below.    The patient was advised to call back or seek an in-person evaluation if the symptoms worsen or if the condition fails to improve as anticipated.    Chiquita CHRISTELLA Barefoot, NP     [1] No Known Allergies [2]  Current Outpatient Medications:    Hydrocortisone Butyrate 0.1 % SOLN, Apply 1 application topically daily as needed. For skin condition., Disp: , Rfl:

## 2024-06-05 ENCOUNTER — Other Ambulatory Visit: Payer: Self-pay | Admitting: Surgery
